# Patient Record
Sex: Male | Born: 1955 | Race: White | Hispanic: No | Marital: Married | Smoking: Current every day smoker
Health system: Southern US, Community
[De-identification: ages and names within clinical notes are randomized; demographics above are authoritative.]

## PROBLEM LIST (undated history)

## (undated) DIAGNOSIS — C4491 Basal cell carcinoma of skin, unspecified: Secondary | ICD-10-CM

## (undated) DIAGNOSIS — B029 Zoster without complications: Secondary | ICD-10-CM

## (undated) DIAGNOSIS — I10 Essential (primary) hypertension: Secondary | ICD-10-CM

## (undated) HISTORY — DX: Basal cell carcinoma of skin, unspecified: C44.91

## (undated) HISTORY — PX: HERNIA REPAIR: SHX51

## (undated) HISTORY — DX: Zoster without complications: B02.9

---

## 2007-07-04 ENCOUNTER — Ambulatory Visit: Payer: Self-pay | Admitting: Gastroenterology

## 2012-05-21 ENCOUNTER — Ambulatory Visit: Payer: Self-pay | Admitting: Medical

## 2012-05-26 ENCOUNTER — Ambulatory Visit: Payer: Self-pay | Admitting: Internal Medicine

## 2012-06-04 ENCOUNTER — Ambulatory Visit: Payer: Self-pay | Admitting: Internal Medicine

## 2015-11-16 DIAGNOSIS — B029 Zoster without complications: Secondary | ICD-10-CM

## 2015-11-16 HISTORY — DX: Zoster without complications: B02.9

## 2015-11-25 ENCOUNTER — Emergency Department: Payer: 59

## 2015-11-25 ENCOUNTER — Other Ambulatory Visit: Payer: Self-pay

## 2015-11-25 ENCOUNTER — Emergency Department
Admission: EM | Admit: 2015-11-25 | Discharge: 2015-11-25 | Disposition: A | Discharge status: Home / Self Care | Payer: 59 | Attending: Emergency Medicine | Admitting: Emergency Medicine

## 2015-11-25 DIAGNOSIS — M25512 Pain in left shoulder: Secondary | ICD-10-CM | POA: Insufficient documentation

## 2015-11-25 DIAGNOSIS — F17210 Nicotine dependence, cigarettes, uncomplicated: Secondary | ICD-10-CM | POA: Diagnosis not present

## 2015-11-25 DIAGNOSIS — R079 Chest pain, unspecified: Secondary | ICD-10-CM | POA: Diagnosis present

## 2015-11-25 LAB — BASIC METABOLIC PANEL
Anion gap: 9 (ref 5–15)
BUN: 22 mg/dL — ABNORMAL HIGH (ref 6–20)
CO2: 24 mmol/L (ref 22–32)
Calcium: 9.1 mg/dL (ref 8.9–10.3)
Chloride: 108 mmol/L (ref 101–111)
Creatinine, Ser: 0.97 mg/dL (ref 0.61–1.24)
GFR calc Af Amer: >60 mL/min (ref >60)
GFR calc non Af Amer: >60 mL/min (ref >60)
Glucose, Bld: 76 mg/dL (ref 65–99)
Potassium: 3.7 mmol/L (ref 3.5–5.1)
Sodium: 141 mmol/L (ref 135–145)

## 2015-11-25 LAB — CBC
HCT: 48.7 % (ref 40.0–52.0)
Hemoglobin: 16.3 g/dL (ref 13.0–18.0)
MCH: 30.3 pg (ref 26.0–34.0)
MCHC: 33.4 g/dL (ref 32.0–36.0)
MCV: 90.5 fL (ref 80.0–100.0)
Platelets: 222 10*3/uL (ref 150–440)
RBC: 5.39 MIL/uL (ref 4.40–5.90)
RDW: 14.2 % (ref 11.5–14.5)
WBC: 7.9 10*3/uL (ref 3.8–10.6)

## 2015-11-25 LAB — TROPONIN I: Troponin I: <0.03 ng/mL (ref <0.031)

## 2015-11-25 MED ORDER — GI COCKTAIL ~~LOC~~
30.0000 mL | Freq: Once | ORAL | Status: AC
  Administered 2015-11-25: 30 mL via ORAL
  Filled 2015-11-25: qty 30

## 2015-11-25 NOTE — Discharge Instructions (Signed)
You have been seen in the emergency department today for chest pain. Your workup has shown normal results. As we discussed please follow-up with your primary care physician in the next 1-2 days for recheck. Return to the emergency department for any further chest pain, trouble breathing, or any other symptom personally concerning to yourself. Please also call the number provided for cardiology to arrange a stress test as soon as possible.   Nonspecific Chest Pain It is often hard to find the cause of chest pain. There is always a chance that your pain could be related to something serious, such as a heart attack or a blood clot in your lungs. Chest pain can also be caused by conditions that are not life-threatening. If you have chest pain, it is very important to follow up with your doctor.  HOME CARE  If you were prescribed an antibiotic medicine, finish it all even if you start to feel better.  Avoid any activities that cause chest pain.  Do not use any tobacco products, including cigarettes, chewing tobacco, or electronic cigarettes. If you need help quitting, ask your doctor.  Do not drink alcohol.  Take medicines only as told by your doctor.  Keep all follow-up visits as told by your doctor. This is important. This includes any further testing if your chest pain does not go away.  Your doctor may tell you to keep your head raised (elevated) while you sleep.  Make lifestyle changes as told by your doctor. These may include:  Getting regular exercise. Ask your doctor to suggest some activities that are safe for you.  Eating a heart-healthy diet. Your doctor or a diet specialist (dietitian) can help you to learn healthy eating options.  Maintaining a healthy weight.  Managing diabetes, if necessary.  Reducing stress. GET HELP IF:  Your chest pain does not go away, even after treatment.  You have a rash with blisters on your chest.  You have a fever. GET HELP RIGHT AWAY  IF:  Your chest pain is worse.  You have an increasing cough, or you cough up blood.  You have severe belly (abdominal) pain.  You feel extremely weak.  You pass out (faint).  You have chills.  You have sudden, unexplained chest discomfort.  You have sudden, unexplained discomfort in your arms, back, neck, or jaw.  You have shortness of breath at any time.  You suddenly start to sweat, or your skin gets clammy.  You feel nauseous.  You vomit.  You suddenly feel light-headed or dizzy.  Your heart begins to beat quickly, or it feels like it is skipping beats. These symptoms may be an emergency. Do not wait to see if the symptoms will go away. Get medical help right away. Call your local emergency services (911 in the [place] Do not drive yourself to the hospital.   This information is not intended to replace advice given to you by your health care provider. Make sure you discuss any questions you have with your health care provider.   Document Released: 04/19/2008 Document Revised: 11/22/2014 Document Reviewed: 06/07/2014 Elsevier Interactive Patient Education [employer].

## 2015-11-25 NOTE — ED Provider Notes (Signed)
[facility] Emergency Department Provider Note  Time seen: 10:21 AM  I have reviewed the triage vital signs and the nursing notes.   HISTORY  Chief Complaint Chest Pain    HPI Stephen Christensen is a 60 y.o. male with no past medical history who presents the emergency department with chest pain. According to the patient last night he developed what he calls heartburn although states he has no history of heartburn. States he had a burning sensation in his chest. He also states for the past 3 or 4 days he has had left shoulder/left arm pain. States it felt like he slept on it wrong, describes it as being worse if he moves it certain directions or pushes on the muscles. Denies any nausea, vomiting, shortness of breath or diaphoresis. Has no cardiac history himself. Denies any leg pain or swelling. Describes the chest discomfort as moderate, gone now.     History reviewed. No pertinent past medical history.  There are no active problems to display for this patient.   Past Surgical History  Procedure Laterality Date  . Hernia repair      No current outpatient prescriptions on file.  Allergies Review of patient's allergies indicates no known allergies.  No family history on file.  Social History Social History  Substance Use Topics  . Smoking status: Current Every Day Smoker    Types: Cigarettes  . Smokeless tobacco: None  . Alcohol Use: Yes    Review of Systems Constitutional: Negative for fever. Cardiovascular: Positive for chest pain which he describes as "heartburn." Now gone Respiratory: Negative for shortness of breath. Gastrointestinal: Negative for abdominal pain Neurological: Negative for headache 10-point ROS otherwise negative.  ____________________________________________   PHYSICAL EXAM:  VITAL SIGNS: ED Triage Vitals  Enc Vitals Group     BP 11/25/15 0949 154/93 mmHg     Pulse Rate 11/25/15 0949 76     Resp 11/25/15 0949 18    Temp 11/25/15 0949 97.9 F (36.6 C)     Temp Source 11/25/15 0949 Oral     SpO2 11/25/15 0949 98 %     Weight 11/25/15 0949 175 lb (79.379 kg)     Height 11/25/15 0949 6' (1.829 m)     Head Cir --      Peak Flow --      Pain Score 11/25/15 0950 7     Pain Loc --      Pain Edu? --      Excl. in [place]? --     Constitutional: Alert and oriented. Well appearing and in no distress. Eyes: Normal exam ENT   Head: Normocephalic and atraumatic.   Mouth/Throat: Mucous membranes are moist. Cardiovascular: Normal rate, regular rhythm. No murmur Respiratory: Normal respiratory effort without tachypnea nor retractions. Breath sounds are clear and equal bilaterally. No wheezes/rales/rhonchi. Gastrointestinal: Soft and nontender. No distention.  Musculoskeletal: Nontender with normal range of motion in all extremities. No lower extremity tenderness or edema. Neurologic:  Normal speech and language. No gross focal neurologic deficits Skin:  Skin is warm, dry and intact.  Psychiatric: Mood and affect are normal. Speech and behavior are normal. Patient exhibits appropriate insight and judgment.  ____________________________________________    EKG  EKG reviewed and interpreted by myself shows normal sinus rhythm at 79 bpm, narrow QRS, normal axis, normal intervals, no ST changes. Overall normal EKG.  ____________________________________________    RADIOLOGY  X-ray consistent with COPD, no acute abnormality  ____________________________________________    INITIAL IMPRESSION /  ASSESSMENT AND PLAN / ED COURSE  Pertinent labs & imaging results that were available during my care of the patient were reviewed by me and considered in my medical decision making (see chart for details).  Patient presents the emergency department with chest discomfort overnight which is now resolved. Also 3-4 days of left shoulder/arm discomfort. The patient's arm discomfort appears to be more musculoskeletal,  denies any discomfort currently however. Patient describes the chest symptoms as feeling like heartburn, however notes that he never gets heartburn. We will check labs and closely monitor in the emergency department. Overall the patient appears very well on exam, denies any discomfort currently.  ----------------------------------------- 11:08 AM on 11/25/2015 -----------------------------------------  Labs are within normal limits including negative troponin. Patient continues to deny any discomfort. We'll discharge the patient home with primary care follow-up. We will also refer to cardiology for stress test. Patient agreeable.  ____________________________________________   FINAL CLINICAL IMPRESSION(S) / ED DIAGNOSES  Chest pain   Harvest Dark, MD 11/25/15 1149

## 2015-11-25 NOTE — ED Notes (Signed)
Pt sitting up in bed, wife at beside, states cp has decreased, mild pain at this time.

## 2015-11-25 NOTE — ED Notes (Signed)
Pt c/o left sided chest pain that radiates into the left arm and left side of neck since yesterday with a period of dizziness yesterday. Pt is in NAD on arrival

## 2015-11-27 ENCOUNTER — Ambulatory Visit
Admission: EM | Admit: 2015-11-27 | Discharge: 2015-11-27 | Disposition: A | Discharge status: Home / Self Care | Payer: 59 | Attending: Family Medicine | Admitting: Family Medicine

## 2015-11-27 DIAGNOSIS — B029 Zoster without complications: Secondary | ICD-10-CM | POA: Diagnosis not present

## 2015-11-27 MED ORDER — FAMCICLOVIR 500 MG PO TABS: 1000.0000 mg | ORAL_TABLET | Freq: Three times a day (TID) | ORAL | Status: DC

## 2015-11-27 NOTE — Discharge Instructions (Signed)
Shingles Shingles is an infection that causes a painful skin rash and fluid-filled blisters. Shingles is caused by the same virus that causes chickenpox. Shingles only develops in people who:  Have had chickenpox.  Have gotten the chickenpox vaccine. (This is rare.) The first symptoms of shingles may be itching, tingling, or pain in an area on your skin. A rash will follow in a few days or weeks. The rash is usually on one side of the body in a bandlike or beltlike pattern. Over time, the rash turns into fluid-filled blisters that break open, scab over, and dry up. Medicines may:  Help you manage pain.  Help you recover more quickly.  Help to prevent long-term problems. HOME CARE Medicines  Take medicines only as told by your doctor.  Apply an anti-itch or numbing cream to the affected area as told by your doctor. Blister and Rash Care  Take a cool bath or put cool compresses on the area of the rash or blisters as told by your doctor. This may help with pain and itching.  Keep your rash covered with a loose bandage (dressing). Wear loose-fitting clothing.  Keep your rash and blisters clean with mild soap and cool water or as told by your doctor.  Check your rash every day for signs of infection. These include redness, swelling, and pain that lasts or gets worse.  Do not pick your blisters.  Do not scratch your rash. General Instructions  Rest as told by your doctor.  Keep all follow-up visits as told by your doctor. This is important.  Until your blisters scab over, your infection can cause chickenpox in people who have never had it or been vaccinated against it. To prevent this from happening, avoid touching other people or being around other people, especially:  Babies.  Pregnant women.  Children who have eczema.  Elderly people who have transplants.  People who have chronic illnesses, such as leukemia or AIDS. GET HELP IF:  Your pain does not get better with  medicine.  Your pain does not get better after the rash heals.  Your rash looks infected. Signs of infection include:  Redness.  Swelling.  Pain that lasts or gets worse. GET HELP RIGHT AWAY IF:  The rash is on your face or nose.  You have pain in your face, pain around your eye area, or loss of feeling on one side of your face.  You have ear pain or you have ringing in your ear.  You have loss of taste.  Your condition gets worse.   This information is not intended to replace advice given to you by your health care provider. Make sure you discuss any questions you have with your health care provider.   Document Released: 04/19/2008 Document Revised: 11/22/2014 Document Reviewed: 08/13/2014 Elsevier Interactive Patient Education 2016 [place] is an infection that causes a painful skin rash and fluid-filled blisters. Shingles is caused by the same virus that causes chickenpox. Shingles only develops in people who:  Have had chickenpox.  Have gotten the chickenpox vaccine. (This is rare.) The first symptoms of shingles may be itching, tingling, or pain in an area on your skin. A rash will follow in a few days or weeks. The rash is usually on one side of the body in a bandlike or beltlike pattern. Over time, the rash turns into fluid-filled blisters that break open, scab over, and dry up. Medicines may:  Help you manage pain.  Help you recover  more quickly.  Help to prevent long-term problems. HOME CARE Medicines  Take medicines only as told by your doctor.  Apply an anti-itch or numbing cream to the affected area as told by your doctor. Blister and Rash Care  Take a cool bath or put cool compresses on the area of the rash or blisters as told by your doctor. This may help with pain and itching.  Keep your rash covered with a loose bandage (dressing). Wear loose-fitting clothing.  Keep your rash and blisters clean with mild soap and cool  water or as told by your doctor.  Check your rash every day for signs of infection. These include redness, swelling, and pain that lasts or gets worse.  Do not pick your blisters.  Do not scratch your rash. General Instructions  Rest as told by your doctor.  Keep all follow-up visits as told by your doctor. This is important.  Until your blisters scab over, your infection can cause chickenpox in people who have never had it or been vaccinated against it. To prevent this from happening, avoid touching other people or being around other people, especially:  Babies.  Pregnant women.  Children who have eczema.  Elderly people who have transplants.  People who have chronic illnesses, such as leukemia or AIDS. GET HELP IF:  Your pain does not get better with medicine.  Your pain does not get better after the rash heals.  Your rash looks infected. Signs of infection include:  Redness.  Swelling.  Pain that lasts or gets worse. GET HELP RIGHT AWAY IF:  The rash is on your face or nose.  You have pain in your face, pain around your eye area, or loss of feeling on one side of your face.  You have ear pain or you have ringing in your ear.  You have loss of taste.  Your condition gets worse.   This information is not intended to replace advice given to you by your health care provider. Make sure you discuss any questions you have with your health care provider.   Document Released: 04/19/2008 Document Revised: 11/22/2014 Document Reviewed: 08/13/2014 Elsevier Interactive Patient Education [employer].

## 2015-11-27 NOTE — ED Notes (Signed)
Pt c/o having a burning sensation a couple days ago in chest. Today woke up with blistery rash on left anterior arm and left upper back.

## 2015-11-27 NOTE — ED Provider Notes (Signed)
CSN: [id number]     Arrival date & time 11/27/15  1006 History   First MD Initiated Contact with Patient 11/27/15 1233    Nurses notes were reviewed. Chief Complaint  Patient presents with  . Rash   Patient reports having a rash on his left back and left arm. He was having pain in his chest on Monday night went to the emergency room on Tuesday and had a full cardiac workup because his left chest pain. Everything was negative when he was sent home on Tuesday but given instructions to have a stress test on Friday. This morning he was shaving a note rash down his left arm. A right C5 shingles.     (Consider location/radiation/quality/duration/timing/severity/associated sxs/prior Treatment) Patient is a 61 y.o. male presenting with rash. The history is provided by the patient. No language interpreter was used.  Rash Location:  Shoulder/arm and torso Shoulder/arm rash location:  L shoulder and L upper arm Quality: blistering, painful, peeling and redness   Pain details:    Quality:  Radiating, sharp, shooting and tingling   Timing:  Constant   Progression:  Partially resolved Severity:  Mild Context: not animal contact, not eggs, not exposure to similar rash, not hot tub use, not insect bite/sting, not plant contact and not pollen   Associated symptoms: no induration     History reviewed. No pertinent past medical history. Past Surgical History  Procedure Laterality Date  . Hernia repair     No family history on file. Social History  Substance Use Topics  . Smoking status: Current Every Day Smoker    Types: Cigarettes  . Smokeless tobacco: None  . Alcohol Use: Yes    Review of Systems  Cardiovascular: Positive for chest pain.  Skin: Positive for rash.  All other systems reviewed and are negative.   Allergies  Review of patient's allergies indicates no known allergies.  Home Medications   Prior to Admission medications   Medication Sig Start Date End Date Taking?  Authorizing Provider  famciclovir (FAMVIR) 500 MG tablet Take 2 tablets (1,000 mg total) by mouth 3 (three) times daily. 11/27/15   Frederich Cha, MD   Meds Ordered and Administered this Visit  Medications - No data to display  BP 134/84 mmHg  Pulse 77  Temp(Src) 97.5 F (36.4 C) (Oral)  Resp 18  Ht 6' (1.829 m)  Wt 175 lb (79.379 kg)  BMI 23.73 kg/m2  SpO2 100% No data found.   Physical Exam  Constitutional: He is oriented to person, place, and time. He appears well-developed and well-nourished.  HENT:  Head: Normocephalic.  Eyes: Conjunctivae are normal. Pupils are equal, round, and reactive to light.  Neck: Normal range of motion.  Musculoskeletal: Normal range of motion.  Neurological: He is alert and oriented to person, place, and time.  Skin: Rash noted.     Rash is consistent with shingles  Psychiatric: He has a normal mood and affect. His behavior is normal.  Vitals reviewed.   ED Course  Procedures (including critical care time)  Labs Review Labs Reviewed - No data to display  Imaging Review No results found.   Visual Acuity Review  Right Eye Distance:   Left Eye Distance:   Bilateral Distance:    Right Eye Near:   Left Eye Near:    Bilateral Near:         MDM   1. Shingles     Patient will be placed on Famvir 1 g 3 times  a day for week. I did stress to him the importance going and getting a stress test done tomorrow.   Frederich Cha, MD 11/27/15 [id number]

## 2015-11-28 ENCOUNTER — Encounter: Payer: Self-pay | Admitting: Cardiovascular Disease

## 2015-11-28 ENCOUNTER — Ambulatory Visit (INDEPENDENT_AMBULATORY_CARE_PROVIDER_SITE_OTHER): Payer: 59 | Admitting: Cardiovascular Disease

## 2015-11-28 VITALS — BP 120/72 | HR 89 | Ht 72.0 in | Wt 173.8 lb

## 2015-11-28 DIAGNOSIS — Z720 Tobacco use: Secondary | ICD-10-CM | POA: Diagnosis not present

## 2015-11-28 DIAGNOSIS — R079 Chest pain, unspecified: Secondary | ICD-10-CM | POA: Diagnosis not present

## 2015-11-28 NOTE — Progress Notes (Signed)
Primary care physician: Dr. Netty Starring  HPI  This is a 60 year old Christensen who was referred by the emergency room at [facility] for evaluation of chest pain. He has no previous cardiac history. He has no significant chronic medical conditions other than tobacco use. He is trying to quit with the use of electronic cigarettes. He is adopted and thus he doesn't know his exact family history. He was told possibly that his grandfather had myocardial infarction but his parents did not have heart disease. Recently, on Monday he started having left arm and shoulder pain which he thought was musculoskeletal in etiology. He went to the emergency room at [facility] where he underwent basic cardiac workup including troponin and EKG as well as a chest x-ray. This came back unremarkable. He was discharged home with instructions to follow-up with cardiology for an outpatient stress test. However, He developed a rash in the same area of the pain consistent with shingles. He went to the emergency room again and was prescribed famciclovir. He is feeling slightly better. Before this illness, he never had any exertional chest pain or shortness of breath. He currently has no exertional symptoms.  No Known Allergies   Current Outpatient Prescriptions on File Prior to Visit  Medication Sig Dispense Refill  . famciclovir (FAMVIR) 500 MG tablet Take 2 tablets (1,000 mg total) by mouth 3 (three) times daily. 21 tablet 0   No current facility-administered medications on file prior to visit.     Past Medical History  Diagnosis Date  . Shingles   . Basal cell carcinoma      Past Surgical History  Procedure Laterality Date  . Hernia repair       Family History  Problem Relation Age of Onset  . Adopted: Yes  . Heart attack Maternal Grandfather 1     Social History   Social History  . Marital Status: Married    Spouse Name: N/A  . Number of Children: N/A  . Years of Education: N/A   Occupational History  . Not on  file.   Social History Main Topics  . Smoking status: Current Every Day Smoker -- 1.00 packs/day for 40 years    Types: Cigarettes  . Smokeless tobacco: Not on file     Comment: E-Cigarette & now smoking 4 cigarettes a day.   . Alcohol Use: Yes     Comment: social   . Drug Use: No  . Sexual Activity: Not on file   Other Topics Concern  . Not on file   Social History Narrative     ROS A 10 point review of system was performed. It is negative other than that mentioned in the history of present illness.   PHYSICAL EXAM   BP 120/72 mmHg  Pulse 89  Ht 6' (1.829 m)  Wt 173 lb 12 oz (Stephen.812 kg)  BMI 23.56 kg/m2 Constitutional: He is oriented to person, place, and time. He appears well-developed and well-nourished. No distress.  HENT: No nasal discharge.  Head: Normocephalic and atraumatic.  Eyes: Pupils are equal and round.  No discharge. Neck: Normal range of motion. Neck supple. No JVD present. No thyromegaly present.  Cardiovascular: Normal rate, regular rhythm, normal heart sounds. Exam reveals no gallop and no friction rub. No murmur heard.  Pulmonary/Chest: Effort normal and breath sounds normal. No stridor. No respiratory distress. He has no wheezes. He has no rales. He exhibits no tenderness.  Abdominal: Soft. Bowel sounds are normal. He exhibits no distension. There is no  tenderness. There is no rebound and no guarding.  Musculoskeletal: Normal range of motion. He exhibits no edema and no tenderness.  Neurological: He is alert and oriented to person, place, and time. Coordination normal.  Skin: Skin is warm and dry. There is a macular rash affecting the left arm and the back of the left shoulder. He is not diaphoretic. No erythema. No pallor.  Psychiatric: He has a normal mood and affect. His behavior is normal. Judgment and thought content normal.       EKG: Normal sinus rhythm with no significant ST or T wave changes. No evidence of prior infarcts. The EKG was  reviewed and interpreted by me.   ASSESSMENT AND PLAN

## 2015-11-28 NOTE — Assessment & Plan Note (Signed)
The patient is mostly having left arm and left shoulder pain with some radiation to the left upper chest area. This is in the same location of his rash that is consistent with shingles. This chest pain is not cardiac in origin. His cardiac exam is normal and baseline ECG is unremarkable. Thus, I do not recommend further cardiac evaluation at the present time. I recommend continue treatment for his shingles. If he continues to have pain after the shingles is treated especially if he complains of exertional symptoms, I advised him to call us back to schedule a stress test.  I did ask him to schedule an appointment with his primary care physician to ensure resolution of symptoms.

## 2015-11-28 NOTE — Patient Instructions (Signed)
Medication Instructions: No change.   Labwork: None.   Procedures/Testing: None.   Follow-Up: As needed with Dr. Fletcher Anon  Any Additional Special Instructions Will Be Listed Below (If Applicable).  Schedule an appointment with your PCP in few weeks. If chest pain does not resolve after treating shingles, call us to schedule a stress test.

## 2015-11-28 NOTE — Assessment & Plan Note (Signed)
I discussed with him the importance of smoking cessation. He is currently trying to quit with the help of electronic cigarettes.

## 2016-07-27 ENCOUNTER — Other Ambulatory Visit: Payer: Self-pay | Admitting: Ophthalmology

## 2016-07-27 DIAGNOSIS — H538 Other visual disturbances: Secondary | ICD-10-CM

## 2016-08-04 ENCOUNTER — Ambulatory Visit
Admission: RE | Admit: 2016-08-04 | Discharge: 2016-08-04 | Disposition: A | Discharge status: Home / Self Care | Payer: 59 | Source: Ambulatory Visit | Attending: Ophthalmology | Admitting: Ophthalmology

## 2016-08-04 ENCOUNTER — Other Ambulatory Visit
Admission: RE | Admit: 2016-08-04 | Discharge: 2016-08-04 | Disposition: A | Discharge status: Home / Self Care | Payer: 59 | Source: Ambulatory Visit | Attending: Ophthalmology | Admitting: Ophthalmology

## 2016-08-04 DIAGNOSIS — H538 Other visual disturbances: Secondary | ICD-10-CM | POA: Insufficient documentation

## 2016-08-04 LAB — CREATININE, SERUM
Creatinine, Ser: 1.07 mg/dL (ref 0.61–1.24)
GFR calc Af Amer: >60 mL/min (ref >60)
GFR calc non Af Amer: >60 mL/min (ref >60)

## 2016-08-04 MED ORDER — GADOBENATE DIMEGLUMINE 529 MG/ML IV SOLN
15.0000 mL | Freq: Once | INTRAVENOUS | Status: AC | PRN
  Administered 2016-08-04: 15 mL via INTRAVENOUS

## 2016-08-23 ENCOUNTER — Ambulatory Visit (INDEPENDENT_AMBULATORY_CARE_PROVIDER_SITE_OTHER): Payer: 59

## 2016-08-23 ENCOUNTER — Ambulatory Visit
Admission: EM | Admit: 2016-08-23 | Discharge: 2016-08-23 | Disposition: A | Discharge status: Home / Self Care | Payer: 59 | Attending: Family Medicine | Admitting: Family Medicine

## 2016-08-23 DIAGNOSIS — J40 Bronchitis, not specified as acute or chronic: Secondary | ICD-10-CM | POA: Diagnosis not present

## 2016-08-23 DIAGNOSIS — J0100 Acute maxillary sinusitis, unspecified: Secondary | ICD-10-CM

## 2016-08-23 MED ORDER — DOXYCYCLINE HYCLATE 100 MG PO CAPS: 100.0000 mg | ORAL_CAPSULE | Freq: Two times a day (BID) | ORAL | 0 refills | Status: DC

## 2016-08-23 MED ORDER — BENZONATATE 100 MG PO CAPS: 100.0000 mg | ORAL_CAPSULE | Freq: Three times a day (TID) | ORAL | 0 refills | Status: DC | PRN

## 2016-08-23 MED ORDER — ACETAMINOPHEN 325 MG PO TABS
650.0000 mg | ORAL_TABLET | Freq: Once | ORAL | Status: AC
  Administered 2016-08-23: 650 mg via ORAL

## 2016-08-23 NOTE — ED Provider Notes (Signed)
[facility] ____________________________________________  Time seen: Approximately 8:39 AM  I have reviewed the triage vital signs and the nursing notes.   HISTORY  Chief Complaint Cough   HPI Stephen Christensen is a 60 y.o. male presents with complaints of 1 week of runny nose, nasal congestion, cough. Patient reports this past Thursday and Friday he started to have more of a cough and intermittent chills. Patient reports he did not check his temperature. Patient reports cough is intermittently productive of thick mucus. States nasal congestion has continued but has somewhat improved in the last few days.States still with some sinus congestion feeling. Reports this is unresolved with over-the-counter medications. Reports his wife at home also sick with similar. Patient states that his wife was seen in urgent care yesterday and diagnosed with bronchitis. Reports he is a smoker.  Denies chest pain, shortness of breath, abdominal pain, dysuria, dizziness, extremity pain, extremity swelling, recent sickness or recent antibiotic use. Denies renal insufficiency.  PCP: Dion Body, MD   Past Medical History:  Diagnosis Date  . Basal cell carcinoma   . Shingles     Patient Active Problem List   Diagnosis Date Noted  . Pain in the chest 11/28/2015  . Tobacco use 11/28/2015    Past Surgical History:  Procedure Laterality Date  . HERNIA REPAIR      Current Outpatient Rx  . Order #: [id number] Class: Normal  . Order #: [id number] Class: Normal  . Order #: [id number] Class: Normal    No current facility-administered medications for this encounter.   Current Outpatient Prescriptions:  .  benzonatate (TESSALON PERLES) 100 MG capsule, Take 1 capsule (100 mg total) by mouth 3 (three) times daily as needed for cough., Disp: 15 capsule, Rfl: 0 .  doxycycline (VIBRAMYCIN) 100 MG capsule, Take 1 capsule (100 mg total) by mouth 2 (two) times daily., Disp: 20 capsule, Rfl: 0 .   famciclovir (FAMVIR) 500 MG tablet, Take 2 tablets (1,000 mg total) by mouth 3 (three) times daily., Disp: 21 tablet, Rfl: 0  Allergies Review of patient's allergies indicates no known allergies.  Family History  Problem Relation Age of Onset  . Adopted: Yes  . Heart attack Maternal Grandfather 45    Social History Social History  Substance Use Topics  . Smoking status: Current Every Day Smoker    Packs/day: 1.00    Years: 40.00    Types: Cigarettes  . Smokeless tobacco: Current User    Types: Snuff     Comment: E-Cigarette & now smoking 5 cigarettes a day.   . Alcohol use Yes     Comment: social     Review of Systems Constitutional: No fever/chills Eyes: No visual changes. ENT: States mild sore throat. As above. Cardiovascular: Denies chest pain. Respiratory: Denies shortness of breath. Gastrointestinal: No abdominal pain.  No nausea, no vomiting.  No diarrhea.  No constipation. Genitourinary: Negative for dysuria. Musculoskeletal: Negative for back pain. Skin: Negative for rash. Neurological: Negative for headaches, focal weakness or numbness.  10-point ROS otherwise negative.  ____________________________________________   PHYSICAL EXAM:  VITAL SIGNS: ED Triage Vitals  Enc Vitals Group     BP 08/23/16 0827 127/81     Pulse Rate 08/23/16 0827 93     Resp 08/23/16 0827 17     Temp 08/23/16 0827 100.1 F (37.8 C)     Temp Source 08/23/16 0827 Oral     SpO2 08/23/16 0827 94 %     Weight 08/23/16 0826 175 lb (79.4 kg)  Height 08/23/16 0826 6' (1.829 m)     Head Circumference --      Peak Flow --      Pain Score 08/23/16 0827 1     Pain Loc --      Pain Edu? --      Excl. in [place]? --    Constitutional: Alert and oriented. Well appearing and in no acute distress. Eyes: Conjunctivae are normal. PERRL. EOMI. Head: Atraumatic. Mild maxillary sinus tenderness to palpation bilaterally. No frontal tenderness. No swelling. No erythema.  Ears: no erythema,  normal TMs bilaterally.   Nose:Nasal congestion with clear rhinorrhea  Mouth/Throat: Mucous membranes are moist. No pharyngeal erythema. No tonsillar swelling or exudate.  Neck: No stridor.  No cervical spine tenderness to palpation. Hematological/Lymphatic/Immunilogical: No cervical lymphadenopathy. Cardiovascular: Normal rate, regular rhythm. Grossly normal heart sounds.  Good peripheral circulation. Respiratory: Normal respiratory effort.  No retractions. Dry intermittent cough. Mild base rhonchi. No wheezes. Good air movement.  Gastrointestinal: Soft and nontender.  No CVA tenderness. Musculoskeletal: No lower or upper extremity tenderness nor edema. No cervical, thoracic or lumbar tenderness to palpation. Neurologic:  Normal speech and language. No gross focal neurologic deficits are appreciated. No gait instability. Skin:  Skin is warm, dry and intact. No rash noted. Psychiatric: Mood and affect are normal. Speech and behavior are normal.  ___________________________________________   LABS (all labs ordered are listed, but only abnormal results are displayed)  Labs Reviewed - No data to display ____________________________________________  RADIOLOGY  Dg Chest 2 View  Result Date: 08/23/2016 CLINICAL DATA:  Cough and fever for 1 week. EXAM: CHEST  2 VIEW COMPARISON:  11/25/2015 FINDINGS: Heart size and pulmonary vascularity are normal and the lungs are clear. No effusions. No significant bone abnormality. IMPRESSION: Normal exam. Electronically Signed   By: Lorriane Shire M.D.   On: 08/23/2016 09:29   ____________________________________________   PROCEDURES Procedures    INITIAL IMPRESSION / ASSESSMENT AND PLAN / ED COURSE  Pertinent labs & imaging results that were available during my care of the patient were reviewed by me and considered in my medical decision making (see chart for details).  Well-appearing patient. No acute distress. Low-grade fever in urgent care this  time. 650 mg oral Tylenol once. Gradual onset of cough and congestion. Will evaluate chest x-ray.  Chest x-ray per radiologist's normal exam. Suspect bronchitis and sinusitis. Will treat patient with oral doxycycline, when necessary Tessalon Perles. Encouraged rest, fluids, over-the-counter Tylenol as needed. Encourage close follow-up with primary care physician.Discussed indication, risks and benefits of medications with patient.  Discussed follow up with Primary care physician this week. Discussed follow up and return parameters including no resolution or any worsening concerns. Patient verbalized understanding and agreed to plan.   ____________________________________________   FINAL CLINICAL IMPRESSION(S) / ED DIAGNOSES  Final diagnoses:  Bronchitis  Acute maxillary sinusitis, recurrence not specified     Discharge Medication List as of 08/23/2016  9:33 AM    START taking these medications   Details  benzonatate (TESSALON PERLES) 100 MG capsule Take 1 capsule (100 mg total) by mouth 3 (three) times daily as needed for cough., Starting Mon 08/23/2016, Normal    doxycycline (VIBRAMYCIN) 100 MG capsule Take 1 capsule (100 mg total) by mouth 2 (two) times daily., Starting Mon 08/23/2016, Normal        Note: This dictation was prepared with Dragon dictation along with smaller phrase technology. Any transcriptional errors that result from this process are unintentional.  Clinical Course      Marylene Land, NP 08/23/16 1005

## 2016-08-23 NOTE — Discharge Instructions (Signed)
Take medication as prescribed. Rest. Drink plenty of fluids.  ° °Follow up with your primary care physician this week. Return to Urgent care for new or worsening concerns.  ° °

## 2016-08-23 NOTE — ED Triage Notes (Signed)
Patient complains of cough with productivity. Patient states that symptoms started one week ago. Patient states that he has had a low grade, fever and chills at first. Patient states that symptoms have constant and he has tried some OTC meds without relief.

## 2016-08-25 ENCOUNTER — Telehealth: Payer: Self-pay

## 2016-08-25 NOTE — Telephone Encounter (Signed)
Courtesy call back completed today for patients recent visit at [facility]. Patient did not answer, left message on voicemail to call back with any questions or concerns.   

## 2016-09-14 ENCOUNTER — Encounter: Payer: Self-pay | Admitting: *Deleted

## 2016-09-15 NOTE — Discharge Instructions (Signed)

## 2016-09-20 ENCOUNTER — Ambulatory Visit
Admission: RE | Admit: 2016-09-20 | Discharge: 2016-09-20 | Disposition: A | Discharge status: Home / Self Care | Payer: Commercial Managed Care - HMO | Source: Ambulatory Visit | Attending: Ophthalmology | Admitting: Ophthalmology

## 2016-09-20 ENCOUNTER — Ambulatory Visit: Payer: Commercial Managed Care - HMO | Admitting: Anesthesiology

## 2016-09-20 ENCOUNTER — Encounter
Admission: RE | Disposition: A | Discharge status: Home / Self Care | Payer: Self-pay | Source: Ambulatory Visit | Attending: Ophthalmology

## 2016-09-20 DIAGNOSIS — H2511 Age-related nuclear cataract, right eye: Secondary | ICD-10-CM | POA: Insufficient documentation

## 2016-09-20 DIAGNOSIS — F17200 Nicotine dependence, unspecified, uncomplicated: Secondary | ICD-10-CM | POA: Diagnosis not present

## 2016-09-20 HISTORY — PX: CATARACT EXTRACTION W/PHACO: SHX586

## 2016-09-20 SURGERY — PHACOEMULSIFICATION, CATARACT, WITH IOL INSERTION
Anesthesia: Monitor Anesthesia Care | Laterality: Right | Wound class: Clean

## 2016-09-20 MED ORDER — BRIMONIDINE TARTRATE 0.2 % OP SOLN
OPHTHALMIC | Status: DC | PRN
  Administered 2016-09-20: 1 [drp] via OPHTHALMIC

## 2016-09-20 MED ORDER — TIMOLOL MALEATE 0.5 % OP SOLN
OPHTHALMIC | Status: DC | PRN
  Administered 2016-09-20: 1 [drp] via OPHTHALMIC

## 2016-09-20 MED ORDER — EPINEPHRINE PF 1 MG/ML IJ SOLN
INTRAOCULAR | Status: DC | PRN
  Administered 2016-09-20: 76 mL via OPHTHALMIC

## 2016-09-20 MED ORDER — LACTATED RINGERS IV SOLN: INTRAVENOUS | Status: DC

## 2016-09-20 MED ORDER — LIDOCAINE HCL (PF) 4 % IJ SOLN
INTRAOCULAR | Status: DC | PRN
  Administered 2016-09-20: 1.5 mL via OPHTHALMIC

## 2016-09-20 MED ORDER — MIDAZOLAM HCL 2 MG/2ML IJ SOLN
INTRAMUSCULAR | Status: DC | PRN
  Administered 2016-09-20: 2 mg via INTRAVENOUS

## 2016-09-20 MED ORDER — MOXIFLOXACIN HCL 0.5 % OP SOLN
1.0000 [drp] | Freq: Three times a day (TID) | OPHTHALMIC | Status: DC
  Administered 2016-09-20 (×3): 1 [drp] via OPHTHALMIC

## 2016-09-20 MED ORDER — NA HYALUR & NA CHOND-NA HYALUR 0.4-0.35 ML IO KIT
PACK | INTRAOCULAR | Status: DC | PRN
  Administered 2016-09-20: 1 mL via INTRAOCULAR

## 2016-09-20 MED ORDER — ARMC OPHTHALMIC DILATING DROPS
1.0000 "application " | OPHTHALMIC | Status: DC | PRN
  Administered 2016-09-20 (×3): 1 via OPHTHALMIC

## 2016-09-20 MED ORDER — FENTANYL CITRATE (PF) 100 MCG/2ML IJ SOLN
INTRAMUSCULAR | Status: DC | PRN
  Administered 2016-09-20: 100 ug via INTRAVENOUS

## 2016-09-20 MED ORDER — CEFUROXIME OPHTHALMIC INJECTION 1 MG/0.1 ML
INJECTION | OPHTHALMIC | Status: DC | PRN
  Administered 2016-09-20: .3 mL via INTRACAMERAL

## 2016-09-20 SURGICAL SUPPLY — 29 items
APPLICATOR COTTON TIP 3IN (MISCELLANEOUS) ×2 IMPLANT
CANNULA ANT/CHMB 27GA (MISCELLANEOUS) ×2 IMPLANT
DISSECTOR HYDRO NUCLEUS 50X22 (MISCELLANEOUS) ×2 IMPLANT
GLOVE BIO SURGEON STRL SZ7 (GLOVE) ×2 IMPLANT
GLOVE SURG LX 6.5 MICRO (GLOVE) ×1
GLOVE SURG LX STRL 6.5 MICRO (GLOVE) ×1 IMPLANT
GOWN STRL REUS W/ TWL LRG LVL3 (GOWN DISPOSABLE) ×2 IMPLANT
GOWN STRL REUS W/TWL LRG LVL3 (GOWN DISPOSABLE) ×2
LENS IOL ACRSF IQ ULTRA 18.0 (Intraocular Lens) ×1 IMPLANT
LENS IOL ACRYSOF IQ 18.0 (Intraocular Lens) ×2 IMPLANT
MARKER SKIN DUAL TIP RULER LAB (MISCELLANEOUS) ×2 IMPLANT
NEEDLE FILTER BLUNT 18X 1/2SAF (NEEDLE) ×1
NEEDLE FILTER BLUNT 18X1 1/2 (NEEDLE) ×1 IMPLANT
PACK CATARACT BRASINGTON (MISCELLANEOUS) ×2 IMPLANT
PACK EYE AFTER SURG (MISCELLANEOUS) ×2 IMPLANT
PACK OPTHALMIC (MISCELLANEOUS) ×2 IMPLANT
RING MALYGIN 7.0 (MISCELLANEOUS) IMPLANT
SOL BAL SALT 15ML (MISCELLANEOUS)
SOLUTION BAL SALT 15ML (MISCELLANEOUS) IMPLANT
SUT ETHILON 10-0 CS-B-6CS-B-6 (SUTURE)
SUT VICRYL  9 0 (SUTURE)
SUT VICRYL 9 0 (SUTURE) IMPLANT
SUTURE EHLN 10-0 CS-B-6CS-B-6 (SUTURE) IMPLANT
SYR 3ML LL SCALE MARK (SYRINGE) ×2 IMPLANT
SYR TB 1ML LUER SLIP (SYRINGE) ×2 IMPLANT
WATER STERILE IRR 250ML POUR (IV SOLUTION) ×2 IMPLANT
WATER STERILE IRR 500ML POUR (IV SOLUTION) IMPLANT
WICK EYE OCUCEL (MISCELLANEOUS) IMPLANT
WIPE NON LINTING 3.25X3.25 (MISCELLANEOUS) ×2 IMPLANT

## 2016-09-20 NOTE — Anesthesia Procedure Notes (Signed)
Procedure Name: MAC Performed by: Kalyani Maeda Pre-anesthesia Checklist: Patient identified, Emergency Drugs available, Suction available, Timeout performed and Patient being monitored Patient Re-evaluated:Patient Re-evaluated prior to inductionOxygen Delivery Method: Nasal cannula Placement Confirmation: positive ETCO2       

## 2016-09-20 NOTE — H&P (Signed)
H+P reviewed and is up to date, please see paper chart.  

## 2016-09-20 NOTE — Transfer of Care (Signed)
Immediate Anesthesia Transfer of Care Note  Patient: Stephen Christensen  Procedure(s) Performed: Procedure(s) with comments: CATARACT EXTRACTION PHACO AND INTRAOCULAR LENS PLACEMENT (IOC) (Right) - RIGHT  Patient Location: PACU  Anesthesia Type: MAC  Level of Consciousness: awake, alert  and patient cooperative  Airway and Oxygen Therapy: Patient Spontanous Breathing and Patient connected to supplemental oxygen  Post-op Assessment: Post-op Vital signs reviewed, Patient's Cardiovascular Status Stable, Respiratory Function Stable, Patent Airway and No signs of Nausea or vomiting  Post-op Vital Signs: Reviewed and stable  Complications: No apparent anesthesia complications

## 2016-09-20 NOTE — Anesthesia Postprocedure Evaluation (Signed)
Anesthesia Post Note  Patient: Stephen Christensen  Procedure(s) Performed: Procedure(s) (LRB): CATARACT EXTRACTION PHACO AND INTRAOCULAR LENS PLACEMENT (IOC) (Right)  Patient location during evaluation: PACU Anesthesia Type: MAC Level of consciousness: awake and alert Pain management: pain level controlled Vital Signs Assessment: post-procedure vital signs reviewed and stable Respiratory status: spontaneous breathing, nonlabored ventilation, respiratory function stable and patient connected to nasal cannula oxygen Cardiovascular status: stable and blood pressure returned to baseline Anesthetic complications: no    Taraneh Metheney

## 2016-09-20 NOTE — Op Note (Signed)
Date of Surgery: 09/20/2016  PREOPERATIVE DIAGNOSES: Visually significant nuclear sclerotic cataract, right eye.  POSTOPERATIVE DIAGNOSES: Same  PROCEDURES PERFORMED: Cataract extraction with intraocular lens implant, right eye.  SURGEON: Almon Hercules, M.D.  ANESTHESIA: MAC and topical  IMPLANTS: [id number] +18.0 D  Implant Name Type Inv. Item Serial No. Manufacturer Lot No. LRB No. Used  LENS IOL ACRYSOF IQ 18.0 - [id number] Intraocular Lens LENS IOL ACRYSOF IQ 18.0 [id number] [employer]   Right 1     COMPLICATIONS: None.  DESCRIPTION OF PROCEDURE: Therapeutic options were discussed with the patient preoperatively, including a discussion of risks and benefits of surgery. Informed consent was obtained. An IOL-Master and immersion biometry were used to take the lens measurements, and a dilated fundus exam was performed within 6 months of the surgical date.  The patient was premedicated and brought to the operating room and placed on the operating table in the supine position. After adequate anesthesia, the patient was prepped and draped in the usual sterile ophthalmic fashion. A wire lid speculum was inserted and the microscope was positioned. A Superblade was used to create a paracentesis site at the limbus and a small amount of dilute preservative free lidocaine was instilled into the anterior chamber, followed by dispersive viscoelastic. A clear corneal incision was created temporally using a 2.4 mm keratome blade. Capsulorrhexis was then performed. In situ phacoemulsification was performed.  Cortical material was removed with the irrigation-aspiration unit. Dispersive viscoelastic was instilled to open the capsular bag. A posterior chamber intraocular lens with the specifications above was inserted and positioned. Irrigation-aspiration was used to remove all viscoelastic. Cefuroxime 1cc was instilled into the anterior chamber, and the corneal incision was checked and found to be water tight.  The eyelid speculum was removed.  The operative eye was covered with protective goggles after instilling 1 drop of timolol and brimonidine. The patient tolerated the procedure well. There were no complications.

## 2016-09-20 NOTE — Anesthesia Preprocedure Evaluation (Signed)
Anesthesia Evaluation  Patient identified by MRN, date of birth, ID band  Reviewed: NPO status   History of Anesthesia Complications Negative for: history of anesthetic complications  Airway Mallampati: II  TM Distance: >3 FB Neck ROM: full    Dental no notable dental hx.    Pulmonary Current Smoker,    Pulmonary exam normal        Cardiovascular Exercise Tolerance: Good negative cardio ROS Normal cardiovascular exam     Neuro/Psych negative neurological ROS  negative psych ROS   GI/Hepatic negative GI ROS, Neg liver ROS,   Endo/Other  negative endocrine ROS  Renal/GU negative Renal ROS  negative genitourinary   Musculoskeletal   Abdominal   Peds  Hematology negative hematology ROS (+)   Anesthesia Other Findings adoped  Reproductive/Obstetrics                             Anesthesia Physical Anesthesia Plan  ASA: II  Anesthesia Plan: MAC   Post-op Pain Management:    Induction:   Airway Management Planned:   Additional Equipment:   Intra-op Plan:   Post-operative Plan:   Informed Consent: I have reviewed the patients History and Physical, chart, labs and discussed the procedure including the risks, benefits and alternatives for the proposed anesthesia with the patient or authorized representative who has indicated his/her understanding and acceptance.     Plan Discussed with: CRNA  Anesthesia Plan Comments:         Anesthesia Quick Evaluation

## 2016-09-21 ENCOUNTER — Encounter: Payer: Self-pay | Admitting: Ophthalmology

## 2016-12-09 DIAGNOSIS — H35351 Cystoid macular degeneration, right eye: Secondary | ICD-10-CM | POA: Diagnosis not present

## 2017-01-06 DIAGNOSIS — H35351 Cystoid macular degeneration, right eye: Secondary | ICD-10-CM | POA: Diagnosis not present

## 2017-01-24 DIAGNOSIS — Z0000 Encounter for general adult medical examination without abnormal findings: Secondary | ICD-10-CM | POA: Diagnosis not present

## 2017-02-10 DIAGNOSIS — H35372 Puckering of macula, left eye: Secondary | ICD-10-CM | POA: Diagnosis not present

## 2017-02-10 DIAGNOSIS — H35351 Cystoid macular degeneration, right eye: Secondary | ICD-10-CM | POA: Diagnosis not present

## 2017-03-10 DIAGNOSIS — Z86010 Personal history of colonic polyps: Secondary | ICD-10-CM | POA: Diagnosis not present

## 2017-03-24 DIAGNOSIS — H35371 Puckering of macula, right eye: Secondary | ICD-10-CM | POA: Diagnosis not present

## 2017-03-24 DIAGNOSIS — H35351 Cystoid macular degeneration, right eye: Secondary | ICD-10-CM | POA: Diagnosis not present

## 2017-03-24 DIAGNOSIS — H43812 Vitreous degeneration, left eye: Secondary | ICD-10-CM | POA: Diagnosis not present

## 2017-04-06 DIAGNOSIS — Z1211 Encounter for screening for malignant neoplasm of colon: Secondary | ICD-10-CM | POA: Diagnosis not present

## 2017-04-21 DIAGNOSIS — H35351 Cystoid macular degeneration, right eye: Secondary | ICD-10-CM | POA: Diagnosis not present

## 2017-05-19 DIAGNOSIS — H35351 Cystoid macular degeneration, right eye: Secondary | ICD-10-CM | POA: Diagnosis not present

## 2017-05-19 DIAGNOSIS — H43812 Vitreous degeneration, left eye: Secondary | ICD-10-CM | POA: Diagnosis not present

## 2017-05-19 DIAGNOSIS — H35373 Puckering of macula, bilateral: Secondary | ICD-10-CM | POA: Diagnosis not present

## 2017-05-23 DIAGNOSIS — D126 Benign neoplasm of colon, unspecified: Secondary | ICD-10-CM | POA: Diagnosis not present

## 2017-05-23 DIAGNOSIS — K640 First degree hemorrhoids: Secondary | ICD-10-CM | POA: Diagnosis not present

## 2017-05-23 DIAGNOSIS — Z86010 Personal history of colonic polyps: Secondary | ICD-10-CM | POA: Diagnosis not present

## 2017-07-21 DIAGNOSIS — H35373 Puckering of macula, bilateral: Secondary | ICD-10-CM | POA: Diagnosis not present

## 2017-07-21 DIAGNOSIS — H43812 Vitreous degeneration, left eye: Secondary | ICD-10-CM | POA: Diagnosis not present

## 2017-07-21 DIAGNOSIS — H35351 Cystoid macular degeneration, right eye: Secondary | ICD-10-CM | POA: Diagnosis not present

## 2017-08-26 DIAGNOSIS — Z23 Encounter for immunization: Secondary | ICD-10-CM | POA: Diagnosis not present

## 2017-10-20 DIAGNOSIS — H35351 Cystoid macular degeneration, right eye: Secondary | ICD-10-CM | POA: Diagnosis not present

## 2017-10-20 DIAGNOSIS — H43812 Vitreous degeneration, left eye: Secondary | ICD-10-CM | POA: Diagnosis not present

## 2017-10-20 DIAGNOSIS — H35373 Puckering of macula, bilateral: Secondary | ICD-10-CM | POA: Diagnosis not present

## 2018-01-16 DIAGNOSIS — Z23 Encounter for immunization: Secondary | ICD-10-CM | POA: Diagnosis not present

## 2018-01-16 DIAGNOSIS — Z125 Encounter for screening for malignant neoplasm of prostate: Secondary | ICD-10-CM | POA: Diagnosis not present

## 2018-01-16 DIAGNOSIS — Z0000 Encounter for general adult medical examination without abnormal findings: Secondary | ICD-10-CM | POA: Diagnosis not present

## 2018-01-16 DIAGNOSIS — Z136 Encounter for screening for cardiovascular disorders: Secondary | ICD-10-CM | POA: Diagnosis not present

## 2018-08-26 DIAGNOSIS — Z23 Encounter for immunization: Secondary | ICD-10-CM | POA: Diagnosis not present

## 2018-08-29 IMAGING — MR MR HEAD WO/W CM
12 series · 48 of 48 positions shown · IV contrast (multihance)
Comparison: None.

CLINICAL DATA: Blurred vision of the right eye, 6 weeks duration.

EXAM:
MRI HEAD WITHOUT AND WITH CONTRAST
TECHNIQUE: Multiplanar, multiecho pulse sequences of the brain and surrounding
structures were obtained without and with intravenous contrast.
CONTRAST:  15mL MULTIHANCE GADOBENATE DIMEGLUMINE 529 MG/ML IV SOLN

[Series 2: T1 · sagittal · 5.0mm · 0.45mm/px · 3 of 25 slices shown (1 of 2)]
[im 1/25]
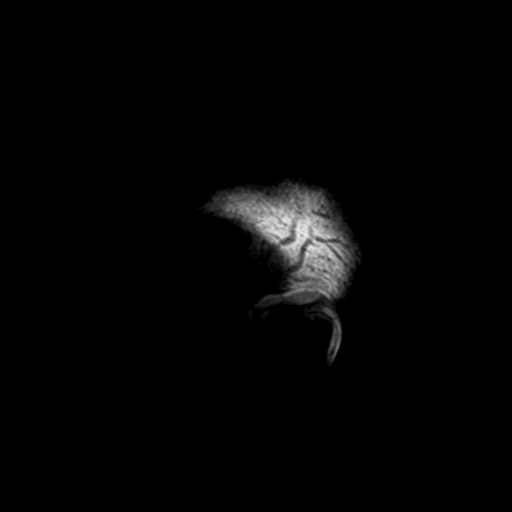
[im 13/25]
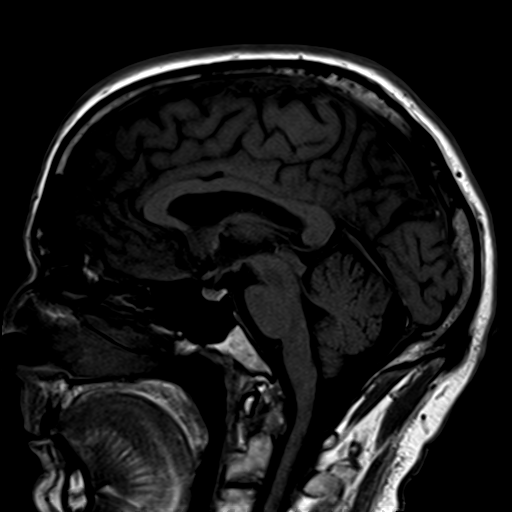
[im 25/25]
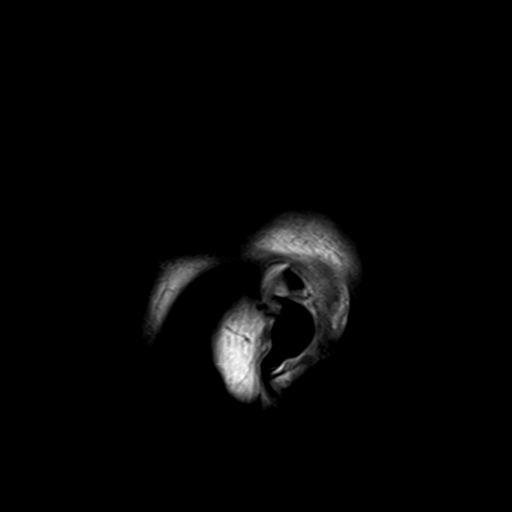

[Series 4: DWI · axial · 3.0mm · 1.20mm/px · z∈[-101,+67]mm · 5 of 58 slices shown (1 of 4)]
[im 1/58]
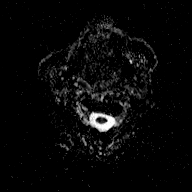
[im 15/58]
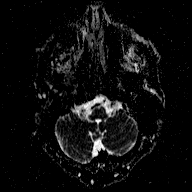
[im 29/58]
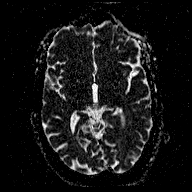
[im 43/58]
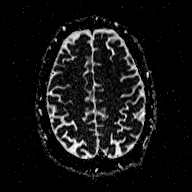
[im 58/58]
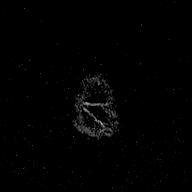

[Series 5: T2 · axial · 5.0mm · 0.72mm/px · z∈[-101,+71]mm · 2 of 28 slices shown (1 of 2)]
[im 1/28]
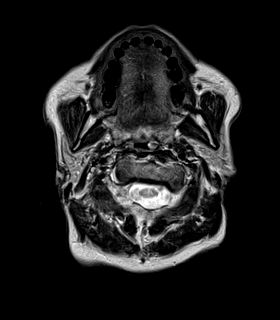
[im 28/28]
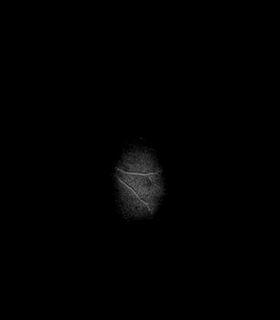

[Series 6: FLAIR · axial · 5.0mm · 0.45mm/px · z∈[-99,+73]mm · 2 of 28 slices shown]
[im 1/28]
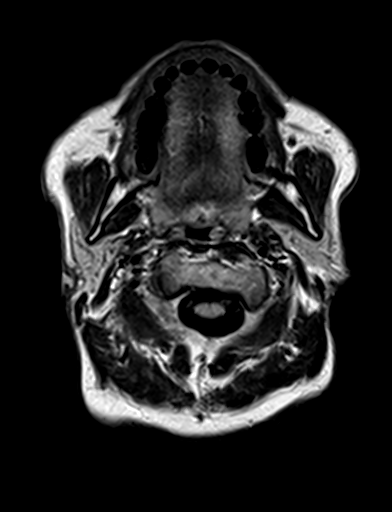
[im 28/28]
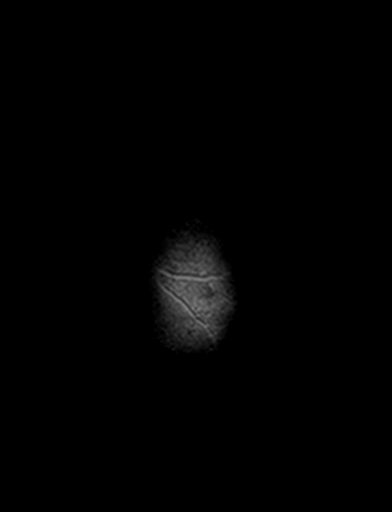

[Series 7: T2 · axial · 5.0mm · 0.72mm/px · z∈[-101,+71]mm · 2 of 28 slices shown (2 of 2)]
[im 1/28]
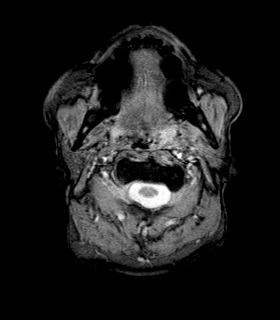
[im 28/28]
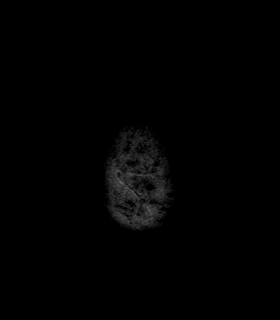

[Series 8: T1 · axial · 3.0mm · 1.00mm/px · z∈[-105,+81]mm · 5 of 64 slices shown (2 of 2)]
[im 1/64]
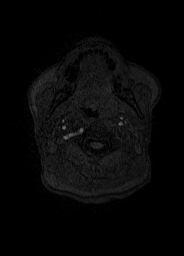
[im 16/64]
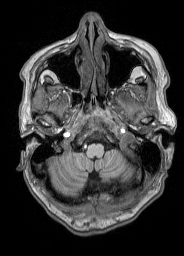
[im 32/64]
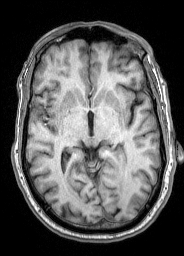
[im 48/64]
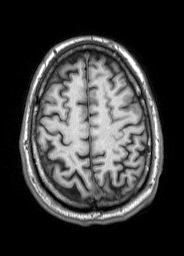
[im 64/64]
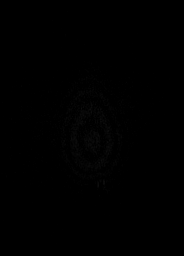

[Series 9: DWI · coronal · 3.0mm · 1.20mm/px · 9 of 104 slices shown (2 of 4)]
[im 1/104]
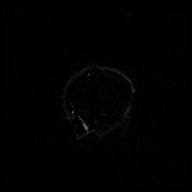
[im 13/104]
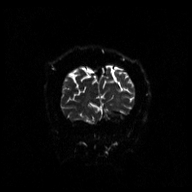
[im 26/104]
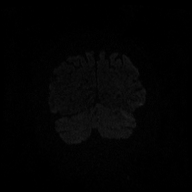
[im 39/104]
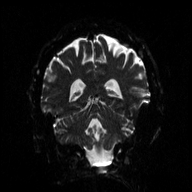
[im 52/104]
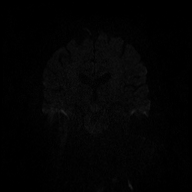
[im 65/104]
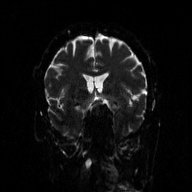
[im 78/104]
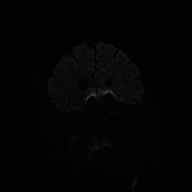
[im 91/104]
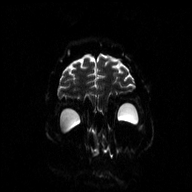
[im 104/104]
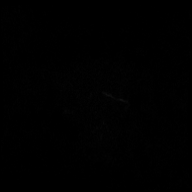

[Series 11: T2 post-contrast · coronal · 5.0mm · 0.45mm/px · 3 of 32 slices shown]
[im 1/32]
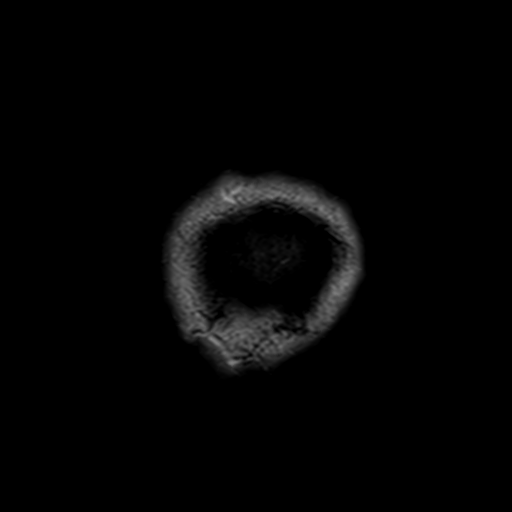
[im 16/32]
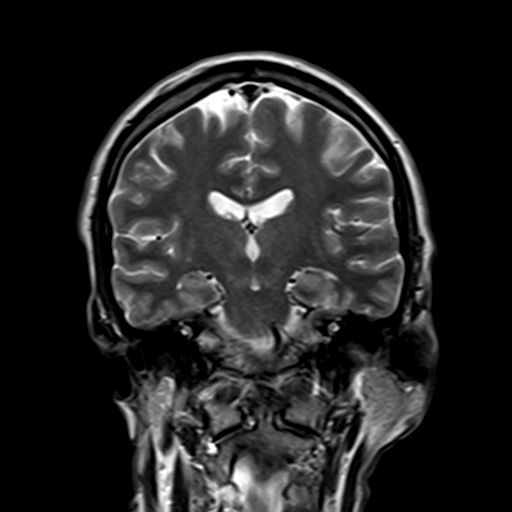
[im 32/32]
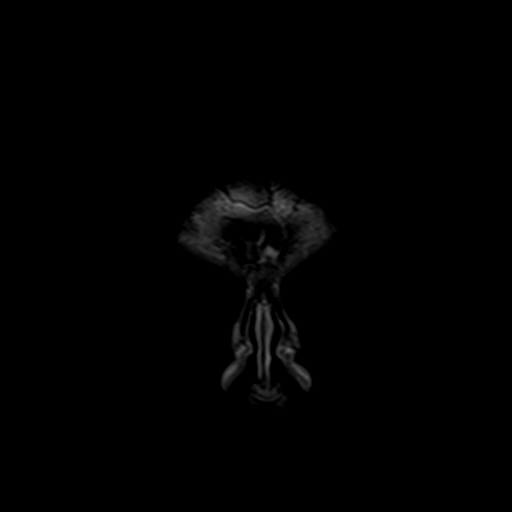

[Series 12: T1 post-contrast · axial · 3.0mm · 1.00mm/px · z∈[-104,+82]mm · 5 of 64 slices shown (1 of 2)]
[im 1/64]
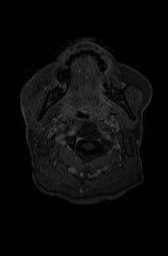
[im 16/64]
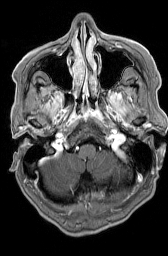
[im 32/64]
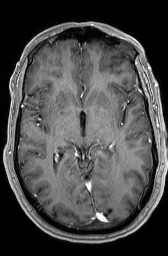
[im 48/64]
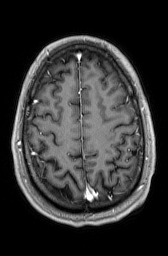
[im 64/64]
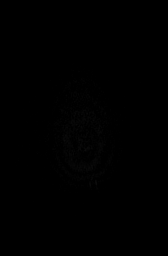

[Series 13: T1 post-contrast · coronal · 5.0mm · 0.45mm/px · 3 of 32 slices shown (2 of 2)]
[im 1/32]
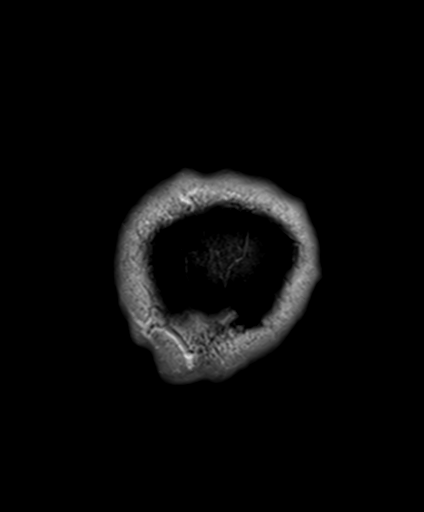
[im 16/32]
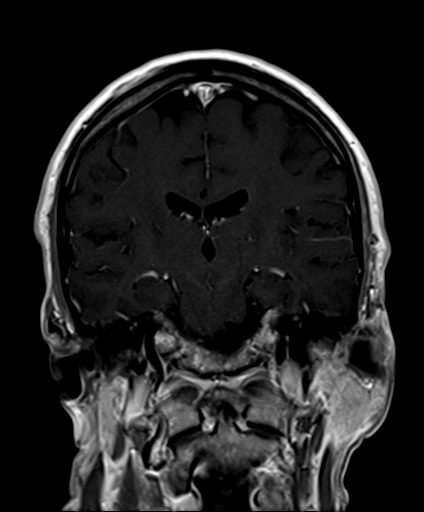
[im 32/32]
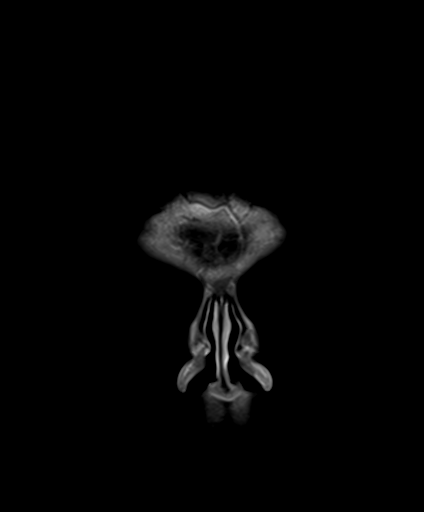

[Series 100: DWI · axial · 3.0mm · 1.20mm/px · z∈[-101,+67]mm · 5 of 58 slices shown (3 of 4)]
[im 1/58]
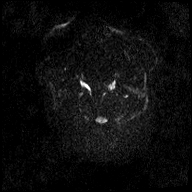
[im 15/58]
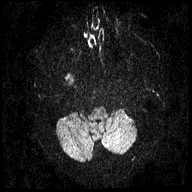
[im 29/58]
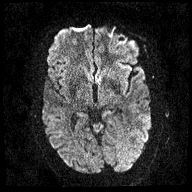
[im 43/58]
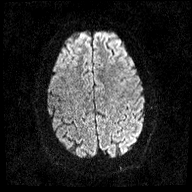
[im 58/58]
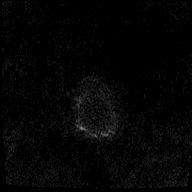

[Series 101: DWI · coronal · 3.0mm · 1.20mm/px · 4 of 52 slices shown (4 of 4)]
[im 1/52]
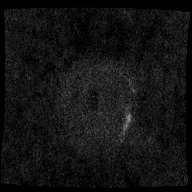
[im 18/52]
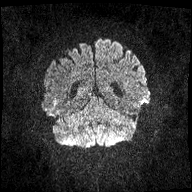
[im 35/52]
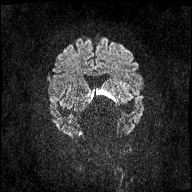
[im 52/52]
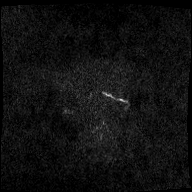

[48 of 48 positions shown; findings below may reference images not displayed]

FINDINGS: Brain: The brain has normal appearance without evidence of
malformation, atrophy, old or acute small or large vessel
infarction, hemorrhage, hydrocephalus or extra-axial collection. No
pituitary abnormality. After contrast administration, no abnormal
enhancement occurs.

Vascular: Major vessels at the base of the brain show flow.

Skull and upper cervical spine: Normal

Sinuses/Orbits: Clear/ normal.

Other: None significant.
IMPRESSION: Normal examination. No abnormality seen to explain the presenting
symptoms.

## 2019-01-18 DIAGNOSIS — N400 Enlarged prostate without lower urinary tract symptoms: Secondary | ICD-10-CM | POA: Diagnosis not present

## 2019-01-18 DIAGNOSIS — Z23 Encounter for immunization: Secondary | ICD-10-CM | POA: Diagnosis not present

## 2019-01-18 DIAGNOSIS — Z0000 Encounter for general adult medical examination without abnormal findings: Secondary | ICD-10-CM | POA: Diagnosis not present

## 2019-01-18 DIAGNOSIS — Z13220 Encounter for screening for lipoid disorders: Secondary | ICD-10-CM | POA: Diagnosis not present

## 2020-11-22 ENCOUNTER — Ambulatory Visit
Admission: EM | Admit: 2020-11-22 | Discharge: 2020-11-22 | Disposition: A | Discharge status: Home / Self Care | Payer: Medicare Other | Attending: Physician Assistant | Admitting: Physician Assistant

## 2020-11-22 ENCOUNTER — Other Ambulatory Visit: Payer: Self-pay

## 2020-11-22 ENCOUNTER — Encounter: Payer: Self-pay | Admitting: Emergency Medicine

## 2020-11-22 DIAGNOSIS — J069 Acute upper respiratory infection, unspecified: Secondary | ICD-10-CM | POA: Diagnosis present

## 2020-11-22 DIAGNOSIS — Z20822 Contact with and (suspected) exposure to covid-19: Secondary | ICD-10-CM

## 2020-11-22 DIAGNOSIS — U071 COVID-19: Secondary | ICD-10-CM | POA: Insufficient documentation

## 2020-11-22 NOTE — ED Provider Notes (Signed)
[facility]    CSN: [id number] Arrival date & time: 11/22/20  0841      History   Chief Complaint Chief Complaint  Patient presents with  . Cough  . Shortness of Breath    HPI Stephen Christensen is a 65 y.o. male who presents with URI with occasional production of clear mucous. Has felt feverish but did not take it. Has had night sweats. Initially had HA and body aches. Has not been tested for covid. He is a smoker. When he got up this morning he felt he could not swallow and therefore could not breath. Denies SOB with exertion or feeling it right now while sitting.     Past Medical History:  Diagnosis Date  . Basal cell carcinoma   . Shingles 11/2015    Patient Active Problem List   Diagnosis Date Noted  . Pain in the chest 11/28/2015  . Tobacco use 11/28/2015    Past Surgical History:  Procedure Laterality Date  . CATARACT EXTRACTION W/PHACO Right 09/20/2016   Procedure: CATARACT EXTRACTION PHACO AND INTRAOCULAR LENS PLACEMENT (IOC);  Surgeon: Ronnell Freshwater, MD;  Location: [place];  Service: Ophthalmology;  Laterality: Right;  RIGHT  . HERNIA REPAIR     as child     Home Medications    Prior to Admission medications   Not on File    Family History Family History  Adopted: Yes  Problem Relation Age of Onset  . Heart attack Maternal Grandfather 27    Social History Social History   Tobacco Use  . Smoking status: Current Every Day Smoker    Packs/day: 1.00    Years: 40.00    Pack years: 40.00    Types: Cigarettes  . Smokeless tobacco: Former Systems developer    Types: Snuff  . Tobacco comment: E-Cigarette & now smoking 5 cigarettes a day.   Vaping Use  . Vaping Use: Every day  Substance Use Topics  . Alcohol use: Yes    Alcohol/week: 3.0 standard drinks    Types: 3 Cans of beer per week    Comment: social   . Drug use: No     Allergies   Patient has no known allergies.   Review of Systems Review of Systems   Constitutional: Positive for diaphoresis and fever. Negative for appetite change, chills and fatigue.  HENT: Positive for congestion and postnasal drip. Negative for ear discharge, ear pain, rhinorrhea, sore throat and trouble swallowing.   Eyes: Negative for discharge.  Respiratory: Positive for cough. Negative for choking, shortness of breath, wheezing and stridor.   Cardiovascular: Negative for chest pain.  Gastrointestinal: Negative for diarrhea, nausea and vomiting.  Musculoskeletal: Negative for gait problem and myalgias.  Skin: Negative for rash.  Neurological: Positive for headaches.  Hematological: Negative for adenopathy.     Physical Exam Triage Vital Signs ED Triage Vitals  Enc Vitals Group     BP 11/22/20 0919 (!) 154/94     Pulse Rate 11/22/20 0919 90     Resp 11/22/20 0919 16     Temp 11/22/20 0919 98.4 F (36.9 C)     Temp Source 11/22/20 0919 Oral     SpO2 11/22/20 0919 98 %     Weight 11/22/20 0917 175 lb (79.4 kg)     Height 11/22/20 0917 5\' 11"  (1.803 m)     Head Circumference --      Peak Flow --      Pain Score 11/22/20 0916 0  Pain Loc --      Pain Edu? --      Excl. in [place]? --    No data found.  Updated Vital Signs BP (!) 154/94 (BP Location: Right Arm)   Pulse 90   Temp 98.4 F (36.9 C) (Oral)   Resp 16   Ht 5\' 11"  (1.803 m)   Wt 175 lb (79.4 kg)   SpO2 98%   BMI 24.41 kg/m   Visual Acuity Right Eye Distance:   Left Eye Distance:   Bilateral Distance:    Right Eye Near:   Left Eye Near:    Bilateral Near:     Physical Exam Physical Exam Vitals signs and nursing note reviewed.  Constitutional:      General: he is not in acute distress.    Appearance: Normal appearance. he is not ill-appearing, toxic-appearing or diaphoretic.  HENT:     Head: Normocephalic.     Right Ear: Tympanic membrane, ear canal and external ear normal.     Left Ear: Tympanic membrane, ear canal and external ear normal.     Nose: Nose normal.      Mouth/Throat:     Mouth: Mucous membranes are moist.  Eyes:     General: No scleral icterus.       Right eye: No discharge.        Left eye: No discharge.     Conjunctiva/sclera: Conjunctivae normal.  Neck:     Musculoskeletal: Neck supple. No neck rigidity.  Cardiovascular:     Rate and Rhythm: Normal rate and regular rhythm.     Heart sounds: No murmur.  Pulmonary:     Effort: Pulmonary effort is normal.     Breath sounds: Normal breath sounds.   Musculoskeletal: Normal range of motion.  Lymphadenopathy:     Cervical: No cervical adenopathy.  Skin:    General: Skin is warm and dry.     Coloration: Skin is not jaundiced.     Findings: No rash.  Neurological:     Mental Status: he is alert and oriented to person, place, and time.     Gait: Gait normal.  Psychiatric:        Mood and Affect: Mood normal.        Behavior: Behavior normal.        Thought Content: Thought content normal.        Judgment: Judgment normal.   I had him eat some crackers to see if he could reproduce what he felt this am  But he was fine. Drank water in my presence with no problem or choking.  UC Treatments / Results  Labs (all labs ordered are listed, but only abnormal results are displayed) Labs Reviewed  SARS CORONAVIRUS 2 (TAT 6-24 HRS)    EKG   Radiology No results found.  Procedures Procedures (including critical care time)  Medications Ordered in UC Medications - No data to display  Initial Impression / Assessment and Plan / UC Course  I have reviewed the triage vital signs and the nursing notes. Viral URI. Covid test pending. Seen instructions  Final Clinical Impressions(s) / UC Diagnoses   Final diagnoses:  Viral URI with cough  Suspected COVID-19 virus infection     Discharge Instructions     Continue taking the Mucinex as needed for the cough. I believe you are recovering from Covid.  Your oxygen is normal and your lungs sounds are good.     ED Prescriptions  None     PDMP not reviewed this encounter.   Shelby Mattocks, [place] 11/22/20 1653

## 2020-11-22 NOTE — ED Triage Notes (Signed)
Patient c/o cough, chest congestion, SOB and low grade fever for a week.

## 2020-11-22 NOTE — Discharge Instructions (Addendum)
Continue taking the Mucinex as needed for the cough. I believe you are recovering from Covid.  Your oxygen is normal and your lungs sounds are good.

## 2020-11-23 LAB — SARS CORONAVIRUS 2 (TAT 6-24 HRS): SARS Coronavirus 2: POSITIVE — AB

## 2022-11-10 ENCOUNTER — Ambulatory Visit
Admission: EM | Admit: 2022-11-10 | Discharge: 2022-11-10 | Disposition: A | Discharge status: Home / Self Care | Payer: Medicare Other | Attending: Family Medicine | Admitting: Family Medicine

## 2022-11-10 ENCOUNTER — Ambulatory Visit (INDEPENDENT_AMBULATORY_CARE_PROVIDER_SITE_OTHER): Payer: Medicare Other

## 2022-11-10 DIAGNOSIS — J205 Acute bronchitis due to respiratory syncytial virus: Secondary | ICD-10-CM | POA: Diagnosis not present

## 2022-11-10 DIAGNOSIS — F17210 Nicotine dependence, cigarettes, uncomplicated: Secondary | ICD-10-CM | POA: Insufficient documentation

## 2022-11-10 DIAGNOSIS — F17200 Nicotine dependence, unspecified, uncomplicated: Secondary | ICD-10-CM

## 2022-11-10 DIAGNOSIS — J441 Chronic obstructive pulmonary disease with (acute) exacerbation: Secondary | ICD-10-CM

## 2022-11-10 DIAGNOSIS — J40 Bronchitis, not specified as acute or chronic: Secondary | ICD-10-CM

## 2022-11-10 DIAGNOSIS — R059 Cough, unspecified: Secondary | ICD-10-CM | POA: Diagnosis present

## 2022-11-10 DIAGNOSIS — Z1152 Encounter for screening for COVID-19: Secondary | ICD-10-CM | POA: Insufficient documentation

## 2022-11-10 DIAGNOSIS — R509 Fever, unspecified: Secondary | ICD-10-CM | POA: Diagnosis present

## 2022-11-10 DIAGNOSIS — R0602 Shortness of breath: Secondary | ICD-10-CM | POA: Diagnosis present

## 2022-11-10 LAB — RESP PANEL BY RT-PCR (RSV, FLU A&B, COVID)  RVPGX2
Influenza A by PCR: NEGATIVE
Influenza B by PCR: NEGATIVE
Resp Syncytial Virus by PCR: POSITIVE — AB
SARS Coronavirus 2 by RT PCR: NEGATIVE

## 2022-11-10 MED ORDER — ALBUTEROL SULFATE HFA 108 (90 BASE) MCG/ACT IN AERS: 2.0000 | INHALATION_SPRAY | RESPIRATORY_TRACT | 0 refills | Status: DC | PRN

## 2022-11-10 MED ORDER — PREDNISONE 50 MG PO TABS: 50.0000 mg | ORAL_TABLET | Freq: Every day | ORAL | 0 refills | Status: AC

## 2022-11-10 MED ORDER — AZITHROMYCIN 250 MG PO TABS: 250.0000 mg | ORAL_TABLET | Freq: Every day | ORAL | 0 refills | Status: DC

## 2022-11-10 NOTE — ED Triage Notes (Signed)
Pt c/o cough x2 days. Denies any fevers,chills or bodyaches.

## 2022-11-10 NOTE — Discharge Instructions (Signed)
Your chest xray did not show a pneumonia.  Stop by the pharmacy to pick up your prescriptions.  Follow up with your primary care provider as needed.

## 2022-11-10 NOTE — ED Provider Notes (Addendum)
[facility]    CSN: [id number] Arrival date & time: 11/10/22  1504      History   Chief Complaint Chief Complaint  Patient presents with   Cough    HPI Stephen Christensen is a 66 y.o. male.   HPI   Stephen Christensen presents for cough since 11/05/22.  Had fever, chills and sweats.  When he moves around he gets short of breath. He smokes cigarettes. Smoked for about 50 years about a pack a day. He took Mucinex. No recent Motrin or Tylenol.  No vomiting, nausea, diarrhea, belly pain, chest pain.  Has rhinorrhea with some nasal congestion.      Past Medical History:  Diagnosis Date   Basal cell carcinoma    Shingles 11/2015    Patient Active Problem List   Diagnosis Date Noted   Pain in the chest 11/28/2015   Tobacco use 11/28/2015    Past Surgical History:  Procedure Laterality Date   CATARACT EXTRACTION W/PHACO Right 09/20/2016   Procedure: CATARACT EXTRACTION PHACO AND INTRAOCULAR LENS PLACEMENT ([place]);  Surgeon: Ronnell Freshwater, MD;  Location: [place];  Service: Ophthalmology;  Laterality: Right;  RIGHT   HERNIA REPAIR     as child       Home Medications    Prior to Admission medications   Medication Sig Start Date End Date Taking? Authorizing Provider  albuterol (VENTOLIN HFA) 108 (90 Base) MCG/ACT inhaler Inhale 2 puffs into the lungs every 4 (four) hours as needed. 11/10/22  Yes Tyaisha Cullom, DO  azithromycin (ZITHROMAX Z-PAK) 250 MG tablet Take 1 tablet (250 mg total) by mouth daily. Take 2 tablets on day 1 11/10/22  Yes Gregg Holster, DO  predniSONE (DELTASONE) 50 MG tablet Take 1 tablet (50 mg total) by mouth daily for 5 days. 11/10/22 11/15/22 Yes Joycie Aerts, Ronnette Juniper, DO    Family History Family History  Adopted: Yes  Problem Relation Age of Onset   Heart attack Maternal Grandfather 64    Social History Social History   Tobacco Use   Smoking status: Every Day    Packs/day: 1.00    Years: 40.00    Total pack years: 40.00     Types: Cigarettes   Smokeless tobacco: Former    Types: Snuff   Tobacco comments:    E-Cigarette & now smoking 5 cigarettes a day.   Vaping Use   Vaping Use: Every day  Substance Use Topics   Alcohol use: Yes    Alcohol/week: 3.0 standard drinks of alcohol    Types: 3 Cans of beer per week    Comment: social    Drug use: No     Allergies   Patient has no known allergies.   Review of Systems Review of Systems: negative unless otherwise stated in HPI.      Physical Exam Triage Vital Signs ED Triage Vitals  Enc Vitals Group     BP 11/10/22 1629 (!) 142/82     Pulse Rate 11/10/22 1629 (!) 105     Resp 11/10/22 1629 16     Temp 11/10/22 1629 99.9 F (37.7 C)     Temp Source 11/10/22 1629 Oral     SpO2 11/10/22 1629 90 %     Weight 11/10/22 1628 190 lb (86.2 kg)     Height 11/10/22 1628 '5\' 11"'$  (1.803 m)     Head Circumference --      Peak Flow --      Pain Score 11/10/22 1628 0  Pain Loc --      Pain Edu? --      Excl. in [place]? --    No data found.  Updated Vital Signs BP (!) 142/82 (BP Location: Left Arm)   Pulse (!) 105   Temp 99.9 F (37.7 C) (Oral)   Resp 16   Ht '5\' 11"'$  (1.803 m)   Wt 86.2 kg   SpO2 90%   BMI 26.50 kg/m   Visual Acuity Right Eye Distance:   Left Eye Distance:   Bilateral Distance:    Right Eye Near:   Left Eye Near:    Bilateral Near:     Physical Exam GEN:     alert, non-toxic appearing male in no distress    HENT:  mucus membranes moist, oropharyngeal without lesions or exudate, no tonsillar hypertrophy, mild oropharyngeal erythema, clear nasal discharge EYES:   pupils equal and reactive, no scleral injection or discharge NECK:  normal ROM  RESP:  no increased work of breathing, coarse breath sounds bilaterally  CVS:   Regular rhythm, tachycardic Skin:   warm and dry    UC Treatments / Results  Labs (all labs ordered are listed, but only abnormal results are displayed) Labs Reviewed  RESP PANEL BY RT-PCR (RSV, FLU  A&B, COVID)  RVPGX2 - Abnormal; Notable for the following components:      Result Value   Resp Syncytial Virus by PCR POSITIVE (*)    All other components within normal limits    EKG   Radiology DG Chest 2 View  Result Date: 11/10/2022 CLINICAL DATA:  Shortness of breath, cough EXAM: CHEST - 2 VIEW COMPARISON:  08/23/2016 FINDINGS: The heart size and mediastinal contours are within normal limits. Both lungs are clear. The visualized skeletal structures are unremarkable. IMPRESSION: No active cardiopulmonary disease. Electronically Signed   By: Rolm Baptise M.D.   On: 11/10/2022 17:14    Procedures Procedures (including critical care time)  Medications Ordered in UC Medications - No data to display  Initial Impression / Assessment and Plan / UC Course  I have reviewed the triage vital signs and the nursing notes.  Pertinent labs & imaging results that were available during my care of the patient were reviewed by me and considered in my medical decision making (see chart for details).       Pt is a 66 y.o. male with tobacco use who presents for 4-5 days of respiratory symptoms. [place] is afebrile here without recent antipyretics. Satting 90% on room air. Overall pt is non-toxic appearing, well hydrated, without respiratory distress. Pulmonary exam is unremarkable.  RSV, COVID and influenza testing obtained. Pt to quarantine until COVID test results or longer if positive.  I will call patient with test results, if positive.   Pt's RSV is positive.  He has a prior CXR from Jan 2017 that indicated COPD. Discussed symptomatic treatment. Typical duration of symptoms discussed. Treat with steroids and antibiotics as below for acute COPD flare secondary to acute RSV illness.  Albuterol sent to pharmacy.   Return and ED precautions given and voiced understanding. Discussed MDM, treatment plan and plan for follow-up with patient who agrees with plan.     Final Clinical Impressions(s) / UC  Diagnoses   Final diagnoses:  Tobacco use disorder  RSV bronchitis  COPD exacerbation ([place])     Discharge Instructions      Your chest xray did not show a pneumonia.  Stop by the pharmacy to pick up your prescriptions.  Follow up with your primary care provider as needed.      ED Prescriptions     Medication Sig Dispense Auth. Provider   predniSONE (DELTASONE) 50 MG tablet Take 1 tablet (50 mg total) by mouth daily for 5 days. 5 tablet Mauricia Mertens, DO   azithromycin (ZITHROMAX Z-PAK) 250 MG tablet Take 1 tablet (250 mg total) by mouth daily. Take 2 tablets on day 1 6 tablet Ivar Domangue, DO   albuterol (VENTOLIN HFA) 108 (90 Base) MCG/ACT inhaler Inhale 2 puffs into the lungs every 4 (four) hours as needed. 6.7 g Lyndee Hensen, DO      PDMP not reviewed this encounter.    Lyndee Hensen, DO 11/10/22 1828

## 2023-03-31 ENCOUNTER — Ambulatory Visit
Admission: EM | Admit: 2023-03-31 | Discharge: 2023-03-31 | Disposition: A | Discharge status: Home / Self Care | Payer: Medicare Other | Attending: Emergency Medicine | Admitting: Emergency Medicine

## 2023-03-31 DIAGNOSIS — N390 Urinary tract infection, site not specified: Secondary | ICD-10-CM | POA: Insufficient documentation

## 2023-03-31 HISTORY — DX: Essential (primary) hypertension: I10

## 2023-03-31 LAB — URINALYSIS, W/ REFLEX TO CULTURE (INFECTION SUSPECTED)
Glucose, UA: NEGATIVE mg/dL
Nitrite: POSITIVE — AB
Protein, ur: 100 mg/dL — AB
Specific Gravity, Urine: 1.02 (ref 1.005–1.030)
WBC, UA: >50 WBC/hpf (ref 0–5)
pH: 5.5 (ref 5.0–8.0)

## 2023-03-31 LAB — URINE CULTURE

## 2023-03-31 MED ORDER — CEFDINIR 300 MG PO CAPS: 300.0000 mg | ORAL_CAPSULE | Freq: Two times a day (BID) | ORAL | 0 refills | Status: AC

## 2023-03-31 MED ORDER — PHENAZOPYRIDINE HCL 200 MG PO TABS: 200.0000 mg | ORAL_TABLET | Freq: Three times a day (TID) | ORAL | 0 refills | Status: DC

## 2023-03-31 NOTE — Discharge Instructions (Signed)
The cefdinir 300 mg twice daily for 10 days for treatment of urinary tract infection.  Use the Pyridium every 8 hours as needed for urinary discomfort.  This will turn your urine a bright red-orange.  Increase your oral fluid intake so that you increase your urine production and or flushing your urinary system.  Take an over-the-counter probiotic, such as Culturelle-Align-Activia, 1 hour after each dose of antibiotic to prevent diarrhea or yeast infections from forming.  We will culture urine and change the antibiotics if necessary.  Return for reevaluation, or see your primary care provider, for any new or worsening symptoms.

## 2023-03-31 NOTE — ED Provider Notes (Signed)
[facility]    CSN: [id number] Arrival date & time: 03/31/23  1323      History   Chief Complaint Chief Complaint  Patient presents with   Dysuria   Fever    HPI Stephen Christensen is a 67 y.o. male.   HPI  67 year old male with a past medical history significant for basal cell carcinoma, hypertension, and shingles presents for evaluation of 3 days worth of urinary frequency along with burning with urination.  He denies any hematuria.  He states that he tried taking Tylenol without any relief of symptoms.  Patient is febrile in clinic with a temperature of 100.1.  Past Medical History:  Diagnosis Date   Basal cell carcinoma    Hypertension    Shingles 11/2015    Patient Active Problem List   Diagnosis Date Noted   Pain in the chest 11/28/2015   Tobacco use 11/28/2015    Past Surgical History:  Procedure Laterality Date   CATARACT EXTRACTION W/PHACO Right 09/20/2016   Procedure: CATARACT EXTRACTION PHACO AND INTRAOCULAR LENS PLACEMENT ([facility]);  Surgeon: Sherald Hess, MD;  Location: [facility];  Service: Ophthalmology;  Laterality: Right;  RIGHT   HERNIA REPAIR     as child       Home Medications    Prior to Admission medications   Medication Sig Start Date End Date Taking? Authorizing Provider  cefdinir (OMNICEF) 300 MG capsule Take 1 capsule (300 mg total) by mouth 2 (two) times daily for 10 days. 03/31/23 04/10/23 Yes Becky Augusta, NP  lisinopril (ZESTRIL) 5 MG tablet 1 tablet Orally Once a day for 90 days 03/08/23  Yes [provider]  phenazopyridine (PYRIDIUM) 200 MG tablet Take 1 tablet (200 mg total) by mouth 3 (three) times daily. 03/31/23  Yes Becky Augusta, NP    Family History Family History  Adopted: Yes  Problem Relation Age of Onset   Heart attack Maternal Grandfather 58    Social History Social History   Tobacco Use   Smoking status: Every Day    Packs/day: 1.00    Years: 40.00    Additional pack years:  0.00    Total pack years: 40.00    Types: Cigarettes   Smokeless tobacco: Former    Types: Snuff   Tobacco comments:    E-Cigarette & now smoking 5 cigarettes a day.   Vaping Use   Vaping Use: Every day  Substance Use Topics   Alcohol use: Yes    Alcohol/week: 3.0 standard drinks of alcohol    Types: 3 Cans of beer per week    Comment: social    Drug use: No     Allergies   Patient has no known allergies.   Review of Systems Review of Systems  Constitutional:  Positive for fever.  Genitourinary:  Positive for dysuria, frequency and urgency. Negative for hematuria.  Musculoskeletal:  Positive for back pain.     Physical Exam Triage Vital Signs ED Triage Vitals  Enc Vitals Group     BP 03/31/23 1336 121/78     Pulse Rate 03/31/23 1336 (!) 110     Resp 03/31/23 1336 16     Temp 03/31/23 1336 100.1 F (37.8 C)     Temp Source 03/31/23 1336 Oral     SpO2 03/31/23 1336 94 %     Weight 03/31/23 1336 189 lb (85.7 kg)     Height 03/31/23 1336 5\' 11"  (1.803 m)     Head Circumference --  Peak Flow --      Pain Score 03/31/23 1339 0     Pain Loc --      Pain Edu? --      Excl. in [place]? --    No data found.  Updated Vital Signs BP 121/78 (BP Location: Right Arm)   Pulse (!) 110   Temp 100.1 F (37.8 C) (Oral)   Resp 16   Ht 5\' 11"  (1.803 m)   Wt 189 lb (85.7 kg)   SpO2 94%   BMI 26.36 kg/m   Visual Acuity Right Eye Distance:   Left Eye Distance:   Bilateral Distance:    Right Eye Near:   Left Eye Near:    Bilateral Near:     Physical Exam Vitals and nursing note reviewed.  Constitutional:      Appearance: Normal appearance. He is not ill-appearing.  HENT:     Head: Normocephalic and atraumatic.  Cardiovascular:     Rate and Rhythm: Normal rate and regular rhythm.     Pulses: Normal pulses.     Heart sounds: Normal heart sounds. No murmur heard.    No friction rub. No gallop.  Pulmonary:     Effort: Pulmonary effort is normal.     Breath  sounds: Normal breath sounds. No wheezing, rhonchi or rales.  Abdominal:     Tenderness: There is no right CVA tenderness or left CVA tenderness.  Skin:    General: Skin is warm and dry.     Capillary Refill: Capillary refill takes less than 2 seconds.  Neurological:     General: No focal deficit present.     Mental Status: He is alert and oriented to person, place, and time.      UC Treatments / Results  Labs (all labs ordered are listed, but only abnormal results are displayed) Labs Reviewed  URINALYSIS, W/ REFLEX TO CULTURE (INFECTION SUSPECTED) - Abnormal; Notable for the following components:      Result Value   Color, Urine AMBER (*)    APPearance HAZY (*)    Hgb urine dipstick MODERATE (*)    Bilirubin Urine SMALL (*)    Ketones, ur TRACE (*)    Protein, ur 100 (*)    Nitrite POSITIVE (*)    Leukocytes,Ua SMALL (*)    Bacteria, UA MANY (*)    All other components within normal limits  URINE CULTURE    EKG   Radiology No results found.  Procedures Procedures (including critical care time)  Medications Ordered in UC Medications - No data to display  Initial Impression / Assessment and Plan / UC Course  I have reviewed the triage vital signs and the nursing notes.  Pertinent labs & imaging results that were available during my care of the patient were reviewed by me and considered in my medical decision making (see chart for details).   She is a very pleasant, nontoxic-appearing 67 year old male presenting for evaluation of UTI symptoms as outlined in HPI above.  A urine was collected at triage and does show that signs of a urinary tract infection.  Urine is amber in color with a hazy appearance, moderate hemoglobin, small bilirubin, trace ketones, 100 protein, nitrite positive, small leukocyte esterase.  Reflex microscopy shows a large number of WBCs, 11-20 RBCs, many bacteria, and WBC clumps.  Urine will reflex to culture.  Patient has had a UTI in the past  and also has a history of enlarged prostate with precancerous lesions found under biopsy  secondary to high PSA.  He is currently followed by [facility] urology.  Given the urine findings above I will start the patient on cefdinir 300 mg twice daily for 10 days for treatment of his UTI.  I will also prescribe Pyridium that he can use for the urinary discomfort.  I have advised him to increase his oral fluid intake so that he increases his urine production and helps flush his urinary tract.  I also recommended that he notify his urologist that he is being treated for urinary tract infection.  ER and return precautions reviewed.   Final Clinical Impressions(s) / UC Diagnoses   Final diagnoses:  Lower urinary tract infectious disease     Discharge Instructions      The cefdinir 300 mg twice daily for 10 days for treatment of urinary tract infection.  Use the Pyridium every 8 hours as needed for urinary discomfort.  This will turn your urine a bright red-orange.  Increase your oral fluid intake so that you increase your urine production and or flushing your urinary system.  Take an over-the-counter probiotic, such as Culturelle-Align-Activia, 1 hour after each dose of antibiotic to prevent diarrhea or yeast infections from forming.  We will culture urine and change the antibiotics if necessary.  Return for reevaluation, or see your primary care provider, for any new or worsening symptoms.      ED Prescriptions     Medication Sig Dispense Auth. Provider   cefdinir (OMNICEF) 300 MG capsule Take 1 capsule (300 mg total) by mouth 2 (two) times daily for 10 days. 20 capsule Becky Augusta, NP   phenazopyridine (PYRIDIUM) 200 MG tablet Take 1 tablet (200 mg total) by mouth 3 (three) times daily. 6 tablet Becky Augusta, NP      PDMP not reviewed this encounter.   Becky Augusta, NP 03/31/23 1428

## 2023-03-31 NOTE — ED Triage Notes (Addendum)
Pt c/o urinary freq,burning,pain & fever x3 days. Has tried tylenol w/o relief. Denies any hematuria.

## 2023-04-02 LAB — URINE CULTURE: Culture: >=100000 — AB

## 2023-04-03 LAB — URINE CULTURE

## 2023-05-17 ENCOUNTER — Other Ambulatory Visit: Payer: Self-pay | Admitting: *Deleted

## 2023-05-17 DIAGNOSIS — R0989 Other specified symptoms and signs involving the circulatory and respiratory systems: Secondary | ICD-10-CM

## 2023-06-12 NOTE — Progress Notes (Unsigned)
[facility] [place]  ASSESSMENT / PLAN: Stephen Christensen is a 67 y.o. male with atherosclerosis of left subclavian artery. No evidence of limb threatening symptoms or vertebrobasilar insufficiency.   Recommend:  Abstinence from all tobacco products. Blood glucose control with goal A1c < 7%. Blood pressure control with goal blood pressure < 140/90 mmHg. Lipid reduction therapy with goal LDL-C <100 mg/dL  Aspirin 81mg  PO QD.  Atorvastatin 40-80mg  PO QD (or other "high intensity" statin therapy).  Follow-up with me in 1 year with repeat duplex.  Patient counseled about more concerning symptoms, and instructed to return to care sooner should these develop.  CHIEF COMPLAINT: Abnormal blood pressures  HISTORY OF PRESENT ILLNESS: Stephen Christensen is a 67 y.o. male referred to clinic for evaluation of subclavian artery stenosis.  The patient was undergoing workup for urinary tract infection.  He was found to have a significantly lower blood pressure in his left arm compared to his right.  This prompted further evaluation.  Duplex ultrasound showed left subclavian artery stenosis.  Patient reports minimal symptoms in the left upper extremity.  He reports some cramping discomfort in his hand which will improve by exercising the hand further.  He does not have rest pain symptoms in his hand.  Is not have ulcers on his hand.  He denies any dizziness.  He reports no dizziness with the use of the left upper extremity.  He has not suffered a "drop attack."   Past Medical History:  Diagnosis Date   Basal cell carcinoma    Hypertension    Shingles 11/2015    Past Surgical History:  Procedure Laterality Date   CATARACT EXTRACTION W/PHACO Right 09/20/2016   Procedure: CATARACT EXTRACTION PHACO AND INTRAOCULAR LENS PLACEMENT ([facility]);  Surgeon: Sherald Hess, MD;  Location: [facility];  Service: Ophthalmology;  Laterality: Right;  RIGHT   HERNIA REPAIR     as child     Family History  Adopted: Yes  Problem Relation Age of Onset   Heart attack Maternal Grandfather 62    Social History   Socioeconomic History   Marital status: Married    Spouse name: Not on file   Number of children: Not on file   Years of education: Not on file   Highest education level: Not on file  Occupational History   Not on file  Tobacco Use   Smoking status: Every Day    Current packs/day: 1.00    Average packs/day: 1 pack/day for 40.0 years (40.0 ttl pk-yrs)    Types: Cigarettes   Smokeless tobacco: Former    Types: Snuff   Tobacco comments:    E-Cigarette & now smoking 5 cigarettes a day.   Vaping Use   Vaping status: Every Day  Substance and Sexual Activity   Alcohol use: Yes    Alcohol/week: 3.0 standard drinks of alcohol    Types: 3 Cans of beer per week    Comment: social    Drug use: No   Sexual activity: Not on file  Other Topics Concern   Not on file  Social History Narrative   Not on file   Social Determinants of Health   Financial Resource Strain: Not on file  Food Insecurity: Not on file  Transportation Needs: Not on file  Physical Activity: Not on file  Stress: Not on file  Social Connections: Not on file  Intimate Partner Violence: Not on file    No Known Allergies  Current Outpatient  Medications  Medication Sig Dispense Refill   lisinopril (ZESTRIL) 5 MG tablet 1 tablet Orally Once a day for 90 days     phenazopyridine (PYRIDIUM) 200 MG tablet Take 1 tablet (200 mg total) by mouth 3 (three) times daily. 6 tablet 0   No current facility-administered medications for this visit.    PHYSICAL EXAM Vitals:   06/13/23 0925 06/13/23 0928  BP: 99/73 (!) 146/91  Pulse: 71   Resp: 20   Temp: 97.9 F (36.6 C)   SpO2: 96%   Weight: 182 lb (82.6 kg)   Height: 5\' 11"  (1.803 m)     Well-appearing man in no acute distress Regular rate and rhythm Unlabored breathing Absent left radial pulse 2+ right radial pulse Discordant blood  pressures (see above)  PERTINENT LABORATORY AND RADIOLOGIC DATA  Most recent CBC    Latest Ref Rng & Units 11/25/2015   10:02 AM  CBC  WBC 3.8 - 10.6 K/uL 7.9   Hemoglobin 13.0 - 18.0 g/dL 09.3   Hematocrit 23.5 - 52.0 % 48.7   Platelets 150 - 440 K/uL 222      Most recent CMP    Latest Ref Rng & Units 08/04/2016   11:04 AM 11/25/2015   10:02 AM  CMP  Glucose 65 - 99 mg/dL  76   BUN 6 - 20 mg/dL  22   Creatinine 5.73 - 1.24 mg/dL 2.20  2.54   Sodium 270 - 145 mmol/L  141   Potassium 3.5 - 5.1 mmol/L  3.7   Chloride 101 - 111 mmol/L  108   CO2 22 - 32 mmol/L  24   Calcium 8.9 - 10.3 mg/dL  9.1    Right Carotid: Velocities in the right ICA are consistent with a 1-39%  stenosis.   Left Carotid: Velocities in the left ICA are consistent with a 1-39%  stenosis.   Vertebrals: Bilateral vertebral arteries demonstrate antegrade flow.  Subclavians: Normal flow hemodynamics were seen in the right subclavian  artery.              Left subclavian artery flow is monophasic.   Rande Brunt. Lenell Antu, MD FACS [facility] Phone Number: [phone] 06/13/2023 12:05 PM   Total time spent on preparing this encounter including chart review, data review, collecting history, examining the patient, coordinating care for this new patient, 45 minutes.  Portions of this report may have been transcribed using voice recognition software.  Every effort has been made to ensure accuracy; however, inadvertent computerized transcription errors may still be present.

## 2023-06-13 ENCOUNTER — Ambulatory Visit (INDEPENDENT_AMBULATORY_CARE_PROVIDER_SITE_OTHER): Payer: Medicare Other | Admitting: Vascular Surgery

## 2023-06-13 ENCOUNTER — Encounter: Payer: Self-pay | Admitting: Vascular Surgery

## 2023-06-13 ENCOUNTER — Ambulatory Visit (HOSPITAL_COMMUNITY)
Admission: RE | Admit: 2023-06-13 | Discharge: 2023-06-13 | Disposition: A | Discharge status: Home / Self Care | Payer: Medicare Other | Source: Ambulatory Visit | Attending: Surgery | Admitting: Surgery

## 2023-06-13 VITALS — BP 146/91 | HR 71 | Temp 97.9°F | Resp 20 | Ht 71.0 in | Wt 182.0 lb

## 2023-06-13 DIAGNOSIS — I771 Stricture of artery: Secondary | ICD-10-CM

## 2023-06-13 DIAGNOSIS — R0989 Other specified symptoms and signs involving the circulatory and respiratory systems: Secondary | ICD-10-CM | POA: Insufficient documentation

## 2023-06-17 ENCOUNTER — Other Ambulatory Visit: Payer: Self-pay

## 2023-06-17 DIAGNOSIS — I771 Stricture of artery: Secondary | ICD-10-CM

## 2023-06-17 DIAGNOSIS — R0989 Other specified symptoms and signs involving the circulatory and respiratory systems: Secondary | ICD-10-CM

## 2024-06-14 ENCOUNTER — Other Ambulatory Visit: Payer: Self-pay | Admitting: Vascular Surgery

## 2024-06-14 DIAGNOSIS — I771 Stricture of artery: Secondary | ICD-10-CM

## 2024-07-09 NOTE — Progress Notes (Unsigned)
 [facility] [place]  ASSESSMENT / PLAN: Stephen Christensen is a 68 y.o. male with atherosclerosis of left subclavian artery. No evidence of limb threatening symptoms or vertebrobasilar insufficiency.   Recommend:  Abstinence from all tobacco products. Blood glucose control with goal A1c < 7%. Blood pressure control with goal blood pressure < 140/90 mmHg. Lipid reduction therapy with goal LDL-C <100 mg/dL  Aspirin 81mg  PO QD.  Atorvastatin 40-80mg  PO QD (or other high intensity statin therapy).  Follow-up with me in 1 year with repeat duplex.  Patient counseled about more concerning symptoms, and instructed to return to care sooner should these develop.  CHIEF COMPLAINT: Abnormal blood pressures  HISTORY OF PRESENT ILLNESS: Stephen Christensen is a 68 y.o. male referred to clinic for evaluation of subclavian artery stenosis.  The patient was undergoing workup for urinary tract infection.  He was found to have a significantly lower blood pressure in his left arm compared to his right.  This prompted further evaluation.  Duplex ultrasound showed left subclavian artery stenosis.  Patient reports minimal symptoms in the left upper extremity.  He reports some cramping discomfort in his hand which will improve by exercising the hand further.  He does not have rest pain symptoms in his hand.  Is not have ulcers on his hand.  He denies any dizziness.  He reports no dizziness with the use of the left upper extremity.  He has not suffered a drop attack.  07/10/24: patient returns to clinic for surveillance. He is doing very well. No new issues with left arm. Stable cramping pain that resolved with activity.    Past Medical History:  Diagnosis Date   Basal cell carcinoma    Hypertension    Shingles 11/2015    Past Surgical History:  Procedure Laterality Date   CATARACT EXTRACTION W/PHACO Right 09/20/2016   Procedure: CATARACT EXTRACTION PHACO AND INTRAOCULAR LENS PLACEMENT (IOC);   Surgeon: Donzell Arlyce Budd, MD;  Location: [facility];  Service: Ophthalmology;  Laterality: Right;  RIGHT   HERNIA REPAIR     as child    Family History  Adopted: Yes  Problem Relation Age of Onset   Heart attack Maternal Grandfather 10    Social History   Socioeconomic History   Marital status: Married    Spouse name: Not on file   Number of children: Not on file   Years of education: Not on file   Highest education level: Not on file  Occupational History   Not on file  Tobacco Use   Smoking status: Every Day    Current packs/day: 1.00    Average packs/day: 1 pack/day for 40.0 years (40.0 ttl pk-yrs)    Types: Cigarettes   Smokeless tobacco: Former    Types: Snuff   Tobacco comments:    E-Cigarette & now smoking 5 cigarettes a day.   Vaping Use   Vaping status: Every Day  Substance and Sexual Activity   Alcohol use: Yes    Alcohol/week: 3.0 standard drinks of alcohol    Types: 3 Cans of beer per week    Comment: social    Drug use: No   Sexual activity: Not on file  Other Topics Concern   Not on file  Social History Narrative   Not on file   Social Drivers of Health   Financial Resource Strain: Not on file  Food Insecurity: Not on file  Transportation Needs: Not on file  Physical Activity: Not on file  Stress: Not on file  Social Connections: Not on file  Intimate Partner Violence: Not on file    No Known Allergies  Current Outpatient Medications  Medication Sig Dispense Refill   lisinopril (ZESTRIL) 10 MG tablet Take 10 mg by mouth daily.     No current facility-administered medications for this visit.    PHYSICAL EXAM Vitals:   07/10/24 1418 07/10/24 1420  BP: 94/63 (!) 171/91  Pulse: 63   Temp: 98.1 F (36.7 C)   SpO2: 92%   Weight: 189 lb (85.7 kg)   Height: 5' 11 (1.803 m)      Well-appearing man in no acute distress Regular rate and rhythm Unlabored breathing Absent left radial pulse 2+ right radial  pulse  PERTINENT LABORATORY AND RADIOLOGIC DATA  Most recent CBC    Latest Ref Rng & Units 11/25/2015   10:02 AM  CBC  WBC 3.8 - 10.6 K/uL 7.9   Hemoglobin 13.0 - 18.0 g/dL 83.6   Hematocrit 59.9 - 52.0 % 48.7   Platelets 150 - 440 K/uL 222      Most recent CMP    Latest Ref Rng & Units 08/04/2016   11:04 AM 11/25/2015   10:02 AM  CMP  Glucose 65 - 99 mg/dL  76   BUN 6 - 20 mg/dL  22   Creatinine 9.38 - 1.24 mg/dL 8.92  9.02   Sodium 864 - 145 mmol/L  141   Potassium 3.5 - 5.1 mmol/L  3.7   Chloride 101 - 111 mmol/L  108   CO2 22 - 32 mmol/L  24   Calcium 8.9 - 10.3 mg/dL  9.1    Right Carotid: The extracranial vessels were near-normal with only minimal  wall                thickening or plaque.   Left Carotid: Velocities in the left ICA are consistent with a 40-59%  stenosis.               SEE COMMENTS ABOVE REGARDING PROXIMAL ICA.   Vertebrals:  Bilateral vertebral arteries demonstrate antegrade flow.  Subclavians: Normal flow hemodynamics were seen in the right subclavian  artery.              Decreased monophasic flow seen throughout left subclavian  artery.   Debby SAILOR. Magda, MD FACS [facility] Phone Number: [phone] 07/09/2024 8:30 PM   Total time spent on preparing this encounter including chart review, data review, collecting history, examining the patient, coordinating care for this established patient, 30 minutes.   Portions of this report may have been transcribed using voice recognition software.  Every effort has been made to ensure accuracy; however, inadvertent computerized transcription errors may still be present.

## 2024-07-10 ENCOUNTER — Ambulatory Visit: Admitting: Vascular Surgery

## 2024-07-10 ENCOUNTER — Encounter: Payer: Self-pay | Admitting: Vascular Surgery

## 2024-07-10 ENCOUNTER — Ambulatory Visit (HOSPITAL_COMMUNITY)
Admission: RE | Admit: 2024-07-10 | Discharge: 2024-07-10 | Disposition: A | Discharge status: Home / Self Care | Source: Ambulatory Visit | Attending: Vascular Surgery | Admitting: Vascular Surgery

## 2024-07-10 VITALS — BP 171/91 | HR 63 | Temp 98.1°F | Ht 71.0 in | Wt 189.0 lb

## 2024-07-10 DIAGNOSIS — I771 Stricture of artery: Secondary | ICD-10-CM | POA: Insufficient documentation

## 2024-07-10 DIAGNOSIS — I6523 Occlusion and stenosis of bilateral carotid arteries: Secondary | ICD-10-CM | POA: Diagnosis not present

## 2024-07-12 ENCOUNTER — Other Ambulatory Visit: Payer: Self-pay | Admitting: Urology

## 2024-07-12 DIAGNOSIS — C61 Malignant neoplasm of prostate: Secondary | ICD-10-CM

## 2024-08-11 ENCOUNTER — Ambulatory Visit
Admission: RE | Admit: 2024-08-11 | Discharge: 2024-08-11 | Disposition: A | Discharge status: Home / Self Care | Source: Ambulatory Visit | Attending: Urology | Admitting: Urology

## 2024-08-11 DIAGNOSIS — C61 Malignant neoplasm of prostate: Secondary | ICD-10-CM

## 2024-08-11 MED ORDER — GADOPICLENOL 0.5 MMOL/ML IV SOLN
9.0000 mL | Freq: Once | INTRAVENOUS | Status: AC | PRN
  Administered 2024-08-11: 9 mL via INTRAVENOUS
